# Patient Record
Sex: Female | Born: 1965
Health system: Southern US, Community
[De-identification: ages and names within clinical notes are randomized; demographics above are authoritative.]

## PROBLEM LIST (undated history)

## (undated) VITALS — BP 100/67 | HR 87 | Temp 97.9°F | Resp 12 | Ht 63.5 in | Wt 132.0 lb

## (undated) DIAGNOSIS — F419 Anxiety disorder, unspecified: Secondary | ICD-10-CM

## (undated) DIAGNOSIS — F32A Depression, unspecified: Secondary | ICD-10-CM

## (undated) DIAGNOSIS — F329 Major depressive disorder, single episode, unspecified: Secondary | ICD-10-CM

## (undated) HISTORY — PX: NO PAST SURGERIES: SHX2092

## (undated) HISTORY — PX: AUGMENTATION MAMMAPLASTY: SUR837

---

## 1998-04-28 ENCOUNTER — Other Ambulatory Visit: Admission: RE | Admit: 1998-04-28 | Discharge: 1998-04-28 | Payer: Self-pay | Admitting: Gynecology

## 1999-08-30 ENCOUNTER — Other Ambulatory Visit: Admission: RE | Admit: 1999-08-30 | Discharge: 1999-08-30 | Payer: Self-pay | Admitting: Gynecology

## 2000-06-05 ENCOUNTER — Other Ambulatory Visit: Admission: RE | Admit: 2000-06-05 | Discharge: 2000-06-05 | Payer: Self-pay | Admitting: Obstetrics & Gynecology

## 2000-06-07 ENCOUNTER — Ambulatory Visit (HOSPITAL_COMMUNITY): Admission: RE | Admit: 2000-06-07 | Discharge: 2000-06-07 | Payer: Self-pay | Admitting: Obstetrics and Gynecology

## 2000-06-07 ENCOUNTER — Encounter: Payer: Self-pay | Admitting: Obstetrics and Gynecology

## 2001-01-16 ENCOUNTER — Inpatient Hospital Stay (HOSPITAL_COMMUNITY): Admission: AD | Admit: 2001-01-16 | Discharge: 2001-01-18 | Payer: Self-pay | Admitting: Obstetrics and Gynecology

## 2001-01-19 ENCOUNTER — Encounter: Admission: RE | Admit: 2001-01-19 | Discharge: 2001-02-18 | Payer: Self-pay | Admitting: Obstetrics & Gynecology

## 2001-05-27 ENCOUNTER — Other Ambulatory Visit: Admission: RE | Admit: 2001-05-27 | Discharge: 2001-05-27 | Payer: Self-pay | Admitting: Gynecology

## 2002-11-06 ENCOUNTER — Other Ambulatory Visit: Admission: RE | Admit: 2002-11-06 | Discharge: 2002-11-06 | Payer: Self-pay | Admitting: Gynecology

## 2003-10-02 ENCOUNTER — Encounter: Admission: RE | Admit: 2003-10-02 | Discharge: 2003-10-02 | Payer: Self-pay | Admitting: Gynecology

## 2004-02-04 ENCOUNTER — Other Ambulatory Visit: Admission: RE | Admit: 2004-02-04 | Discharge: 2004-02-04 | Payer: Self-pay | Admitting: Gynecology

## 2005-03-31 ENCOUNTER — Other Ambulatory Visit: Admission: RE | Admit: 2005-03-31 | Discharge: 2005-03-31 | Payer: Self-pay | Admitting: Gynecology

## 2005-08-09 ENCOUNTER — Encounter: Admission: RE | Admit: 2005-08-09 | Discharge: 2005-08-09 | Payer: Self-pay | Admitting: Gynecology

## 2006-05-24 ENCOUNTER — Other Ambulatory Visit: Admission: RE | Admit: 2006-05-24 | Discharge: 2006-05-24 | Payer: Self-pay | Admitting: Family Medicine

## 2006-08-10 ENCOUNTER — Encounter: Admission: RE | Admit: 2006-08-10 | Discharge: 2006-08-10 | Payer: Self-pay | Admitting: Gynecology

## 2007-06-13 ENCOUNTER — Other Ambulatory Visit: Admission: RE | Admit: 2007-06-13 | Discharge: 2007-06-13 | Payer: Self-pay | Admitting: Family Medicine

## 2008-07-07 ENCOUNTER — Encounter: Admission: RE | Admit: 2008-07-07 | Discharge: 2008-07-07 | Payer: Self-pay | Admitting: Family Medicine

## 2008-07-07 ENCOUNTER — Other Ambulatory Visit: Admission: RE | Admit: 2008-07-07 | Discharge: 2008-07-07 | Payer: Self-pay | Admitting: Family Medicine

## 2009-01-07 ENCOUNTER — Encounter (INDEPENDENT_AMBULATORY_CARE_PROVIDER_SITE_OTHER): Payer: Self-pay | Admitting: General Surgery

## 2009-01-07 ENCOUNTER — Ambulatory Visit (HOSPITAL_BASED_OUTPATIENT_CLINIC_OR_DEPARTMENT_OTHER): Admission: RE | Admit: 2009-01-07 | Discharge: 2009-01-07 | Payer: Self-pay | Admitting: General Surgery

## 2009-08-12 ENCOUNTER — Encounter: Admission: RE | Admit: 2009-08-12 | Discharge: 2009-08-12 | Payer: Self-pay | Admitting: Obstetrics and Gynecology

## 2009-12-08 ENCOUNTER — Other Ambulatory Visit: Admission: RE | Admit: 2009-12-08 | Discharge: 2009-12-08 | Payer: Self-pay | Admitting: Family Medicine

## 2010-11-22 LAB — POCT HEMOGLOBIN-HEMACUE: Hemoglobin: 12.2 g/dL (ref 12.0–15.0)

## 2010-12-27 NOTE — Op Note (Signed)
NAMEMARLIE, KUENNEN                ACCOUNT NO.:  [id number]   MEDICAL RECORD NO.:  [id number]          PATIENT TYPE:  AMB   LOCATION:  DSC                          FACILITY:  [facility]   PHYSICIAN:  Almond Lint, MD       DATE OF BIRTH:  1966/05/22   DATE OF PROCEDURE:  01/07/2009  DATE OF DISCHARGE:                               OPERATIVE REPORT   PREOPERATIVE DIAGNOSIS:  External hemorrhoids.   POSTOPERATIVE DIAGNOSIS:  Internal and external hemorrhoids.   PROCEDURE:  External hemorrhoidectomy x3.   SURGEON:  Almond Lint, MD   ANESTHESIA:  General and local.   FINDINGS:  One large external hemorrhoid at the left posterior position,  a moderate size external hemorrhoids at the left anterior position, and  the small external hemorrhoid at the right posterior position.  There  was a small internal hemorrhoid at the left lateral position.   SPECIMENS:  External hemorrhoids to Pathology.   ESTIMATED BLOOD LOSS:  Minimal.   COMPLICATIONS:  None known.   PROCEDURE IN DETAIL:  Ms. Jungwirth was identified in the holding area and  taken to the operating room where she was placed supine on the operating  room table.  General anesthesia was induced.  She was then placed in a  lithotomy position and her perineum was prepped and draped in a sterile  fashion.  The anoscope was advanced through the anus to evaluate the  internal anal canal.  There were some very very small hemorrhoids  present and one small hemorrhoid at the left lateral position.  This was  not treated as it was very minimal.  The external hemorrhoids were  injected with local anesthesia.  First, the left posterior one was  addressed as this was the largest one.  The base was injected with local  anesthetic and this was then removed with the Bovie cautery.  The left  anterior was the next largest and this was similarly treated.  It was  injected with local anesthetic and then removed with the Bovie.  The  third one was  very small in the right posterior aspect and this was  removed with the Bovie after administration of local.  This small defect  was not closed.  The two defects on the left were closed using 3-0  chromic in interrupted fashion.  The anus was reexamined and there was  not significant tightening of the anus after this procedure.  There was  no residual bleeding seen.  The area was then cleaned and dried and  lidocaine jelly was placed on a Gelfoam  pad and advanced through the anal canal right into the distal rectum.  The incisions were then also treated with lidocaine jelly.  The patient  was then dressed with gauze, ABDs, and mesh underwear.  She was taken to  the PACU in stable condition.      Almond Lint, MD  Electronically Signed     FB/MEDQ  D:  01/07/2009  T:  01/08/2009  Job:  [id number]

## 2011-03-27 ENCOUNTER — Other Ambulatory Visit: Payer: Self-pay | Admitting: Physician Assistant

## 2011-03-27 ENCOUNTER — Other Ambulatory Visit (HOSPITAL_COMMUNITY)
Admission: RE | Admit: 2011-03-27 | Discharge: 2011-03-27 | Disposition: A | Discharge status: Home / Self Care | Payer: BC Managed Care – PPO | Source: Ambulatory Visit | Attending: Family Medicine | Admitting: Family Medicine

## 2011-03-27 DIAGNOSIS — Z124 Encounter for screening for malignant neoplasm of cervix: Secondary | ICD-10-CM | POA: Insufficient documentation

## 2012-04-02 ENCOUNTER — Other Ambulatory Visit: Payer: Self-pay | Admitting: Physician Assistant

## 2012-04-02 ENCOUNTER — Other Ambulatory Visit (HOSPITAL_COMMUNITY)
Admission: RE | Admit: 2012-04-02 | Discharge: 2012-04-02 | Disposition: A | Discharge status: Home / Self Care | Payer: BC Managed Care – PPO | Source: Ambulatory Visit | Attending: Family Medicine | Admitting: Family Medicine

## 2012-04-02 DIAGNOSIS — Z01419 Encounter for gynecological examination (general) (routine) without abnormal findings: Secondary | ICD-10-CM | POA: Insufficient documentation

## 2012-05-10 ENCOUNTER — Other Ambulatory Visit: Payer: Self-pay | Admitting: Physician Assistant

## 2012-05-10 DIAGNOSIS — Z1231 Encounter for screening mammogram for malignant neoplasm of breast: Secondary | ICD-10-CM

## 2012-05-16 ENCOUNTER — Ambulatory Visit
Admission: RE | Admit: 2012-05-16 | Discharge: 2012-05-16 | Disposition: A | Discharge status: Home / Self Care | Payer: BC Managed Care – PPO | Source: Ambulatory Visit | Attending: Physician Assistant | Admitting: Physician Assistant

## 2012-05-16 DIAGNOSIS — Z1231 Encounter for screening mammogram for malignant neoplasm of breast: Secondary | ICD-10-CM

## 2012-11-15 ENCOUNTER — Encounter (HOSPITAL_COMMUNITY): Payer: Self-pay | Admitting: *Deleted

## 2012-11-15 ENCOUNTER — Inpatient Hospital Stay (HOSPITAL_COMMUNITY)
Admission: RE | Admit: 2012-11-15 | Discharge: 2012-11-18 | DRG: 430 | Disposition: A | Discharge status: Home / Self Care | Payer: BC Managed Care – PPO | Attending: Psychiatry | Admitting: Psychiatry

## 2012-11-15 DIAGNOSIS — F331 Major depressive disorder, recurrent, moderate: Principal | ICD-10-CM | POA: Diagnosis present

## 2012-11-15 DIAGNOSIS — F1310 Sedative, hypnotic or anxiolytic abuse, uncomplicated: Secondary | ICD-10-CM | POA: Diagnosis present

## 2012-11-15 DIAGNOSIS — Z79899 Other long term (current) drug therapy: Secondary | ICD-10-CM

## 2012-11-15 DIAGNOSIS — F1010 Alcohol abuse, uncomplicated: Secondary | ICD-10-CM | POA: Diagnosis present

## 2012-11-15 LAB — URINE MICROSCOPIC-ADD ON

## 2012-11-15 LAB — URINALYSIS, ROUTINE W REFLEX MICROSCOPIC
Hgb urine dipstick: NEGATIVE
Nitrite: NEGATIVE
Protein, ur: NEGATIVE mg/dL
Urobilinogen, UA: 1 mg/dL (ref 0.0–1.0)

## 2012-11-15 LAB — PREGNANCY, URINE: Preg Test, Ur: NEGATIVE

## 2012-11-15 LAB — RAPID URINE DRUG SCREEN, HOSP PERFORMED
Amphetamines: NOT DETECTED
Tetrahydrocannabinol: NOT DETECTED

## 2012-11-15 MED ORDER — VITAMIN B-1 100 MG PO TABS
100.0000 mg | ORAL_TABLET | Freq: Every day | ORAL | Status: DC
  Administered 2012-11-16 – 2012-11-18 (×3): 100 mg via ORAL
  Filled 2012-11-15 (×4): qty 1

## 2012-11-15 MED ORDER — HYDROXYZINE HCL 25 MG PO TABS: 25.0000 mg | ORAL_TABLET | Freq: Four times a day (QID) | ORAL | Status: AC | PRN

## 2012-11-15 MED ORDER — LOPERAMIDE HCL 2 MG PO CAPS: 2.0000 mg | ORAL_CAPSULE | ORAL | Status: AC | PRN

## 2012-11-15 MED ORDER — TRAZODONE HCL 50 MG PO TABS
50.0000 mg | ORAL_TABLET | Freq: Every evening | ORAL | Status: DC | PRN
  Administered 2012-11-15 – 2012-11-17 (×3): 50 mg via ORAL
  Filled 2012-11-15 (×8): qty 1

## 2012-11-15 MED ORDER — ONDANSETRON 4 MG PO TBDP: 4.0000 mg | ORAL_TABLET | Freq: Four times a day (QID) | ORAL | Status: AC | PRN

## 2012-11-15 MED ORDER — CHLORDIAZEPOXIDE HCL 25 MG PO CAPS
25.0000 mg | ORAL_CAPSULE | ORAL | Status: AC
  Administered 2012-11-17 (×2): 25 mg via ORAL
  Filled 2012-11-15: qty 1

## 2012-11-15 MED ORDER — THIAMINE HCL 100 MG/ML IJ SOLN: 100.0000 mg | Freq: Once | INTRAMUSCULAR | Status: DC

## 2012-11-15 MED ORDER — ACETAMINOPHEN 325 MG PO TABS: 650.0000 mg | ORAL_TABLET | Freq: Four times a day (QID) | ORAL | Status: DC | PRN

## 2012-11-15 MED ORDER — ENSURE COMPLETE PO LIQD
237.0000 mL | Freq: Two times a day (BID) | ORAL | Status: DC
  Administered 2012-11-17 – 2012-11-18 (×3): 237 mL via ORAL

## 2012-11-15 MED ORDER — CHLORDIAZEPOXIDE HCL 25 MG PO CAPS
25.0000 mg | ORAL_CAPSULE | Freq: Once | ORAL | Status: AC
  Administered 2012-11-15: 25 mg via ORAL

## 2012-11-15 MED ORDER — CHLORDIAZEPOXIDE HCL 25 MG PO CAPS
25.0000 mg | ORAL_CAPSULE | Freq: Four times a day (QID) | ORAL | Status: AC | PRN
  Administered 2012-11-17: 25 mg via ORAL
  Filled 2012-11-15: qty 1

## 2012-11-15 MED ORDER — CHLORDIAZEPOXIDE HCL 25 MG PO CAPS
25.0000 mg | ORAL_CAPSULE | Freq: Four times a day (QID) | ORAL | Status: AC
  Administered 2012-11-15 (×3): 25 mg via ORAL
  Filled 2012-11-15 (×3): qty 1

## 2012-11-15 MED ORDER — ALUM & MAG HYDROXIDE-SIMETH 200-200-20 MG/5ML PO SUSP: 30.0000 mL | ORAL | Status: DC | PRN

## 2012-11-15 MED ORDER — CHLORDIAZEPOXIDE HCL 25 MG PO CAPS
25.0000 mg | ORAL_CAPSULE | Freq: Three times a day (TID) | ORAL | Status: AC
  Administered 2012-11-16 (×3): 25 mg via ORAL
  Filled 2012-11-15 (×3): qty 1

## 2012-11-15 MED ORDER — MAGNESIUM HYDROXIDE 400 MG/5ML PO SUSP
30.0000 mL | Freq: Every day | ORAL | Status: DC | PRN
  Administered 2012-11-17: 30 mL via ORAL

## 2012-11-15 MED ORDER — CHLORDIAZEPOXIDE HCL 25 MG PO CAPS
25.0000 mg | ORAL_CAPSULE | Freq: Every day | ORAL | Status: AC
  Administered 2012-11-18: 25 mg via ORAL
  Filled 2012-11-15 (×2): qty 1

## 2012-11-15 MED ORDER — ADULT MULTIVITAMIN W/MINERALS CH
1.0000 | ORAL_TABLET | Freq: Every day | ORAL | Status: DC
  Administered 2012-11-15 – 2012-11-18 (×4): 1 via ORAL
  Filled 2012-11-15 (×5): qty 1

## 2012-11-15 NOTE — Progress Notes (Signed)
[facility] LCSW Group Therapy  11/15/2012 1:15  Type of Therapy:  Group Therapy 1:15 to 2:30  Participation Level:  Active  Participation Quality:  Appropriate, Attentive and Sharing  Affect:   Appropriate  Cognitive:  Alert, Appropriate and Oriented  Insight:  Developing  Engagement in Therapy: Appropriate  Modes of Intervention:  Clarification, Discussion, Exploration, Orientation and Reality Testing  Summary of Progress/Problems: Focus of group processing discussion was on balance in life; the components in life which have a negative influence on balance and the components which make for a more balanced life.  Patient shared that she has felt overwhelmed for months and the alcohol and xanax have gotten out of control.  Patient reports she has financial, relationship, environmental stressors and currently feels out of control whereas her usual mode of operation is strict control and order. Patient reports she arranged today to come in for detox knowing her daughter would be with ex for the next week yet with the stress of today's plan still honored her daughter's request to throw the softball for practice this morning before school.   Clide Dales
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
## 2012-11-15 NOTE — Progress Notes (Signed)
Nutrition Brief Note  Patient identified on the Malnutrition Screening Tool (MST) Report  Body mass index is 24.14 kg/(m^2). Patient meets criteria for WNL based on current BMI.   Current diet order is regular, patient is consuming approximately small% of meals at this time. Labs and medications reviewed.   Patient stated that she had not been eating well for the past 2 weeks prior to admit with a 6 lb weight loss in the past week.  Poor intake secondary to poor appetite, stress and anxiety.  Eating small amounts here.  Complains of stomach pain related to stress and anxiety.  Ht 5'2", Wt:  132 lbs, UBW 138 lbs.  Complaints of increased thirst for the past 2 days and very strong smelling urine.  Will order Ensure Complete po BID, each supplement provides 350 kcal and 13 grams of protein. Discussed importance of good nutrition and tips to increase intake with decreased appetite.  Encouraged extra fluid intake to improve hydration.  HgbA1C pending.  Meds include MVI and thiamine.  No nutrition interventions warranted at this time. If nutrition issues arise, please consult RD.   Oran Rein, RD, LDN Clinical Inpatient Dietitian Pager:  [phone] Weekend and after hours pager:  [phone]
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
## 2012-11-15 NOTE — Progress Notes (Signed)
Took over Pt's care at 2330.  Resting in bed, no distress noted, even and unlabored respirations, will continue to monitor.

## 2012-11-16 DIAGNOSIS — F1010 Alcohol abuse, uncomplicated: Secondary | ICD-10-CM | POA: Diagnosis present

## 2012-11-16 DIAGNOSIS — F1310 Sedative, hypnotic or anxiolytic abuse, uncomplicated: Secondary | ICD-10-CM

## 2012-11-16 DIAGNOSIS — F339 Major depressive disorder, recurrent, unspecified: Secondary | ICD-10-CM

## 2012-11-16 DIAGNOSIS — F331 Major depressive disorder, recurrent, moderate: Principal | ICD-10-CM | POA: Diagnosis present

## 2012-11-16 LAB — HEPATIC FUNCTION PANEL
ALT: 14 U/L (ref 0–35)
AST: 21 U/L (ref 0–37)
Albumin: 4.1 g/dL (ref 3.5–5.2)
Alkaline Phosphatase: 74 U/L (ref 39–117)
Total Protein: 7.6 g/dL (ref 6.0–8.3)

## 2012-11-16 LAB — COMPREHENSIVE METABOLIC PANEL
ALT: 14 U/L (ref 0–35)
AST: 21 U/L (ref 0–37)
Albumin: 4.1 g/dL (ref 3.5–5.2)
Alkaline Phosphatase: 74 U/L (ref 39–117)
Potassium: 3.5 mEq/L (ref 3.5–5.1)
Sodium: 138 mEq/L (ref 135–145)
Total Protein: 7.7 g/dL (ref 6.0–8.3)

## 2012-11-16 LAB — CBC
Hemoglobin: 13.9 g/dL (ref 12.0–15.0)
MCHC: 34.3 g/dL (ref 30.0–36.0)
RDW: 12.5 % (ref 11.5–15.5)
WBC: 4.7 10*3/uL (ref 4.0–10.5)

## 2012-11-16 LAB — MAGNESIUM: Magnesium: 1.8 mg/dL (ref 1.5–2.5)

## 2012-11-16 LAB — LIPID PANEL
Cholesterol: 214 mg/dL — ABNORMAL HIGH (ref 0–200)
LDL Cholesterol: 102 mg/dL — ABNORMAL HIGH (ref 0–99)
Total CHOL/HDL Ratio: 2.2 RATIO
VLDL: 15 mg/dL (ref 0–40)

## 2012-11-16 LAB — TSH: TSH: 1.093 u[IU]/mL (ref 0.350–4.500)

## 2012-11-16 MED ORDER — IBUPROFEN 600 MG PO TABS: 600.0000 mg | ORAL_TABLET | Freq: Four times a day (QID) | ORAL | Status: DC | PRN

## 2012-11-16 MED ORDER — CITALOPRAM HYDROBROMIDE 40 MG PO TABS
40.0000 mg | ORAL_TABLET | Freq: Every day | ORAL | Status: DC
  Administered 2012-11-16 – 2012-11-18 (×3): 40 mg via ORAL
  Filled 2012-11-16 (×4): qty 1

## 2012-11-16 MED ORDER — GABAPENTIN 100 MG PO CAPS
100.0000 mg | ORAL_CAPSULE | Freq: Three times a day (TID) | ORAL | Status: DC
  Administered 2012-11-16 – 2012-11-18 (×7): 100 mg via ORAL
  Filled 2012-11-16 (×9): qty 1

## 2012-11-16 NOTE — H&P (Signed)
  Pt was seen by me today and I agree with the key elements documented in H&P.  

## 2012-11-16 NOTE — Progress Notes (Signed)
Psychoeducational Group Note  Date:  11/16/2012 Time: 1015  Group Topic/Focus:  Identifying Goals :   The focus of this group is to help patients identify their personal goal for the day. Pt stated she wanted to " begin to accept that she can stand up on my own ".  Participation Level:  active Participation Quality: good Affect: flat Cognitive:  good  Insight:  good  Engagement in Group: engaged  Additional Comments:    PD RN [facility]

## 2012-11-16 NOTE — [facility] Suicide Risk Assessment (Signed)
Suicide Risk Assessment  Admission Assessment     Nursing information obtained from:  Patient Demographic factors:  Divorced or widowed;Caucasian;Unemployed Current Mental Status:   no si, no hi, no avh, logical Loss Factors:  Decrease in vocational status;Loss of significant relationship;Financial problems / change in socioeconomic status Historical Factors:  Family history of mental illness or substance abuse;Impulsivity Risk Reduction Factors:  Responsible for children under 54 years of age;Sense of responsibility to family;Living with another person, especially a relative;Positive social support;Positive therapeutic relationship  CLINICAL FACTORS:   Alcohol/Substance Abuse/Dependencies  COGNITIVE FEATURES THAT CONTRIBUTE TO RISK:  Closed-mindedness    SUICIDE RISK:   Mild:  Suicidal ideation of limited frequency, intensity, duration, and specificity.  There are no identifiable plans, no associated intent, mild dysphoria and related symptoms, good self-control (both objective and subjective assessment), few other risk factors, and identifiable protective factors, including available and accessible social support.  PLAN OF CARE:   Will re-start celexa for depression  I certify that inpatient services furnished can reasonably be expected to improve the patient's condition.  Wonda Cerise 11/16/2012, 1:10 PM
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
## 2012-11-16 NOTE — [facility] Group Notes (Signed)
[facility] LCSW Group Therapy  11/16/2012 10am  Type of Therapy:  Group Therapy  Participation Level:  Active  Participation Quality:  Appropriate, Attentive and Sharing  Affect:  Blunted  Cognitive:  Appropriate  Insight:  Developing/Improving  Engagement in Therapy:  Engaged  Modes of Intervention:  Exploration  Summary of Progress/Problems:  The main focus of today's process group was for the patient to identify ways in which they have in the past sabotaged their own recovery. Motivational Interviewing was utilized to ask the group members what they get out of their substance use, and how their behavior interferes with who they want to be.  The Stages of Change were explained, and members rated their motivation to change on a scale of 0 (no desire) to 10 (completely committed).  The patient expressed that she cannot take care of her 49yo daughter, cannot even take care of herself.  She is not the mother she wants to be.  The doctor called her out of the room prior to the end of group.  Sarina Ser 11/16/2012, 12:39 PM
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
## 2012-11-17 DIAGNOSIS — F331 Major depressive disorder, recurrent, moderate: Principal | ICD-10-CM

## 2012-11-17 DIAGNOSIS — F1010 Alcohol abuse, uncomplicated: Secondary | ICD-10-CM

## 2012-11-17 DIAGNOSIS — F1310 Sedative, hypnotic or anxiolytic abuse, uncomplicated: Secondary | ICD-10-CM

## 2012-11-17 MED ORDER — GLYCERIN (LAXATIVE) 2.1 G RE SUPP
1.0000 | RECTAL | Status: DC | PRN
  Administered 2012-11-18: 1 via RECTAL
  Filled 2012-11-17: qty 1

## 2012-11-17 NOTE — Progress Notes (Signed)
Thankfulness Group Note  Date: 11/17/2012 Time: 0900  Group Topic/Focus:  Thankfulness Group :   The focus of this group is to help patients identify things / people / events they are thankful for..  Participation Level:  Did Not Attend  PAdditional Comments:    11/17/2012,9:44 AM Rich Brave

## 2012-11-17 NOTE — Progress Notes (Signed)
Patient ID: Heather Reilly, female   DOB: [birth year], 47 y.o.   MRN: [id number] D)  Has been out and about in the milieu, interacting with peers appropriately, was in her room working on homework assigned earlier, feels she needed help and is trying to get herself straightened out, getting her focus back on how she needs to be for herself and her family.  Attemnded group, played cards and had a snack, appreciates her roommate who is also supportive. A)  Will continue to monitor ffor safety, support and encouragement, continue POC R)  Safety maintained.

## 2012-11-17 NOTE — Progress Notes (Signed)
[facility] Group Notes:  (Nursing/MHT/Case Management/Adjunct)  Date:  11/16/2012  Time:  2100  Type of Therapy:  wrap up group  Participation Level:  Active  Participation Quality:  Appropriate, Attentive, Sharing and Supportive  Affect:  Appropriate  Cognitive:  Alert and Appropriate  Insight:  Good  Engagement in Group:  Engaged  Modes of Intervention:  Clarification, Education and Support  Summary of Progress/Problems:  Heather Reilly 11/17/2012, 2:57 AM

## 2012-11-17 NOTE — [facility] Group Notes (Signed)
[facility] LCSW Group Therapy  11/17/2012   Did not attend  Ambrose Mantle, LCSW 11/17/2012, 12:11 PM

## 2012-11-17 NOTE — Progress Notes (Signed)
Patient ID: Heather Reilly, female   DOB: 1965/12/26, 47 y.o.   MRN: [id number] D)  Has been out and about, participating in the milieu and interacting appropriately with peers.  Came up to the window and requested a glycerine suppository, order was obtained.  States has been having a good evening, feeling better, less anxious, feels she is getting her plans together for after discharge. Feeling that she is past the acute phase of withdrawal, and is making plans to move ahead with her life, considering going back to school.  Denies thoughts of self harm. A)  Will continue to monitor for safety, offer support and encouragement R)  Safety maintained

## 2012-11-17 NOTE — Progress Notes (Signed)
Psychoeducational Group Note  Date:  11/17/2012 Time: 1015 Group Topic/Focus:  Making Healthy Choices:   The focus of this group is to help patients identify negative/unhealthy choices they were using prior to admission and identify positive/healthier coping strategies to replace them upon discharge.  Participation Level:  Active  Participation Quality:  Appropriate  Affect:  Appropriate  Cognitive:  Appropriate  Insight:  Engaged  Engagement in Group:  Engaged  Additional Comments:    Heather Reilly 4:23 PM. 11/17/2012

## 2012-11-17 NOTE — Progress Notes (Signed)
[facility] MD Progress Note  11/17/2012 3:56 PM Heather Reilly  MRN:  [id number]  Subjective:  Feeling pretty. Woke this am without any anxiety. Slept well. I think that I will give Neurontin a try".  Diagnosis:  Axis I: Major depressive disorder, recurrent episode, moderate, Alcohol abuse, Benzodiazepine abuse Axis II: Deferred Axis III: History reviewed. No pertinent past medical history. Axis IV: other psychosocial or environmental problems and Substance absue issues Axis V: 41-50 serious symptoms  ADL's:  Intact  Sleep: Good  Appetite:  Good  Suicidal Ideation:  Denies Homicidal Ideation:  Denies  AEB (as evidenced by):  Psychiatric Specialty Exam: Review of Systems  Constitutional: Negative.   HENT: Negative.   Eyes: Negative.   Respiratory: Negative.   Cardiovascular: Negative.   Gastrointestinal: Negative.   Musculoskeletal: Negative.   Skin: Negative.   Neurological: Negative.   Endo/Heme/Allergies: Negative.   Psychiatric/Behavioral: Positive for depression and substance abuse. Negative for suicidal ideas, hallucinations and memory loss. The patient is not nervous/anxious and does not have insomnia.     Blood pressure 98/62, pulse 90, temperature 98.3 F (36.8 C), temperature source Oral, resp. rate 18, height 5' 3.5" (1.613 m), weight 59.875 kg (132 lb), last menstrual period 11/15/2012, SpO2 99.00%.Body mass index is 23.01 kg/(m^2).  General Appearance: Casual  Eye Contact::  Good  Speech:  Clear and Coherent  Volume:  Normal  Mood:  better  Affect:  Appropriate  Thought Process:  Coherent  Orientation:  Full (Time, Place, and Person)  Thought Content:  Rumination  Suicidal Thoughts:  No  Homicidal Thoughts:  No  Memory:  Immediate;   Good Recent;   Good Remote;   Good  Judgement:  Fair  Insight:  Fair  Psychomotor Activity:  Normal  Concentration:  Good  Recall:  Good  Akathisia:  No  Handed:  Right  AIMS (if indicated):     Assets:  Desire for  Improvement  Sleep:  Number of Hours: 5.5   Current Medications: Current Facility-Administered Medications  Medication Dose Route Frequency Provider Last Rate Last Dose  . acetaminophen (TYLENOL) tablet 650 mg  650 mg Oral Q6H PRN Shuvon Rankin, NP      . alum & mag hydroxide-simeth (MAALOX/MYLANTA) 200-200-20 MG/5ML suspension 30 mL  30 mL Oral Q4H PRN Shuvon Rankin, NP      . chlordiazePOXIDE (LIBRIUM) capsule 25 mg  25 mg Oral Q6H PRN Shuvon Rankin, NP   25 mg at 11/17/12 1214  . chlordiazePOXIDE (LIBRIUM) capsule 25 mg  25 mg Oral BH-qamhs Shuvon Rankin, NP   25 mg at 11/17/12 0906   Followed by  . [START ON 11/18/2012] chlordiazePOXIDE (LIBRIUM) capsule 25 mg  25 mg Oral Daily Shuvon Rankin, NP      . citalopram (CELEXA) tablet 40 mg  40 mg Oral Daily Sanjuana Kava, NP   40 mg at 11/17/12 0906  . feeding supplement (ENSURE COMPLETE) liquid 237 mL  237 mL Oral BID BM Jeoffrey Massed, RD   237 mL at 11/17/12 1342  . gabapentin (NEURONTIN) capsule 100 mg  100 mg Oral TID Sanjuana Kava, NP   100 mg at 11/17/12 1211  . hydrOXYzine (ATARAX/VISTARIL) tablet 25 mg  25 mg Oral Q6H PRN Shuvon Rankin, NP      . ibuprofen (ADVIL,MOTRIN) tablet 600 mg  600 mg Oral Q6H PRN Sanjuana Kava, NP      . loperamide (IMODIUM) capsule 2-4 mg  2-4 mg Oral PRN Shuvon Rankin, NP      .  magnesium hydroxide (MILK OF MAGNESIA) suspension 30 mL  30 mL Oral Daily PRN Shuvon Rankin, NP      . multivitamin with minerals tablet 1 tablet  1 tablet Oral Daily Shuvon Rankin, NP   1 tablet at 11/17/12 0907  . ondansetron (ZOFRAN-ODT) disintegrating tablet 4 mg  4 mg Oral Q6H PRN Shuvon Rankin, NP      . thiamine (B-1) injection 100 mg  100 mg Intramuscular Once Shuvon Rankin, NP      . thiamine (VITAMIN B-1) tablet 100 mg  100 mg Oral Daily Shuvon Rankin, NP   100 mg at 11/17/12 0907  . traZODone (DESYREL) tablet 50 mg  50 mg Oral QHS,MR X 1 Nanine Means, NP   50 mg at 11/16/12 2315    Lab Results:  Results for orders placed  during the hospital encounter of 11/15/12 (from the past 48 hour(s))  CBC     Status: None   Collection Time    11/16/12  6:32 AM      Result Value Range   WBC 4.7  4.0 - 10.5 K/uL   RBC 4.47  3.87 - 5.11 MIL/uL   Hemoglobin 13.9  12.0 - 15.0 g/dL   HCT 44.0  10.2 - 72.5 %   MCV 90.6  78.0 - 100.0 fL   MCH 31.1  26.0 - 34.0 pg   MCHC 34.3  30.0 - 36.0 g/dL   RDW 36.6  44.0 - 34.7 %   Platelets 248  150 - 400 K/uL  COMPREHENSIVE METABOLIC PANEL     Status: None   Collection Time    11/16/12  6:32 AM      Result Value Range   Sodium 138  135 - 145 mEq/L   Potassium 3.5  3.5 - 5.1 mEq/L   Chloride 100  96 - 112 mEq/L   CO2 30  19 - 32 mEq/L   Glucose, Bld 90  70 - 99 mg/dL   BUN 10  6 - 23 mg/dL   Creatinine, Ser 4.25  0.50 - 1.10 mg/dL   Calcium 9.4  8.4 - 95.6 mg/dL   Total Protein 7.7  6.0 - 8.3 g/dL   Albumin 4.1  3.5 - 5.2 g/dL   AST 21  0 - 37 U/L   ALT 14  0 - 35 U/L   Alkaline Phosphatase 74  39 - 117 U/L   Total Bilirubin 0.6  0.3 - 1.2 mg/dL   GFR calc non Af Amer >90  >90 mL/min   GFR calc Af Amer >90  >90 mL/min   Comment:            The eGFR has been calculated     using the CKD EPI equation.     This calculation has not been     validated in all clinical     situations.     eGFR's persistently     <90 mL/min signify     possible Chronic Kidney Disease.  ETHANOL     Status: None   Collection Time    11/16/12  6:32 AM      Result Value Range   Alcohol, Ethyl (B) <11  0 - 11 mg/dL   Comment:            LOWEST DETECTABLE LIMIT FOR     SERUM ALCOHOL IS 11 mg/dL     FOR MEDICAL PURPOSES ONLY  HEMOGLOBIN A1C     Status: None   Collection Time  11/16/12  6:32 AM      Result Value Range   Hemoglobin A1C 5.1  <5.7 %   Comment: (NOTE)                                                                               According to the ADA Clinical Practice Recommendations for 2011, when     HbA1c is used as a screening test:      >=6.5%   Diagnostic of Diabetes  Mellitus               (if abnormal result is confirmed)     5.7-6.4%   Increased risk of developing Diabetes Mellitus     References:Diagnosis and Classification of Diabetes Mellitus,Diabetes     Care,2011,34(Suppl 1):S62-S69 and Standards of Medical Care in             Diabetes - 2011,Diabetes Care,2011,34 (Suppl 1):S11-S61.   Mean Plasma Glucose 100  <117 mg/dL  HEPATIC FUNCTION PANEL     Status: None   Collection Time    11/16/12  6:32 AM      Result Value Range   Total Protein 7.6  6.0 - 8.3 g/dL   Albumin 4.1  3.5 - 5.2 g/dL   AST 21  0 - 37 U/L   ALT 14  0 - 35 U/L   Alkaline Phosphatase 74  39 - 117 U/L   Total Bilirubin 0.6  0.3 - 1.2 mg/dL   Bilirubin, Direct 0.1  0.0 - 0.3 mg/dL   Indirect Bilirubin 0.5  0.3 - 0.9 mg/dL  LIPID PANEL     Status: Abnormal   Collection Time    11/16/12  6:32 AM      Result Value Range   Cholesterol 214 (*) 0 - 200 mg/dL   Triglycerides 76  <161 mg/dL   HDL 97  >09 mg/dL   Total CHOL/HDL Ratio 2.2     VLDL 15  0 - 40 mg/dL   LDL Cholesterol 604 (*) 0 - 99 mg/dL   Comment:            Total Cholesterol/HDL:CHD Risk     Coronary Heart Disease Risk Table                         Men   Women      1/2 Average Risk   3.4   3.3      Average Risk       5.0   4.4      2 X Average Risk   9.6   7.1      3 X Average Risk  23.4   11.0                Use the calculated Patient Ratio     above and the CHD Risk Table     to determine the patient's CHD Risk.                ATP III CLASSIFICATION (LDL):      <100     mg/dL   Optimal      540-981  mg/dL   Near or  Above                        Optimal      130-159  mg/dL   Borderline      161-096  mg/dL   High      >045     mg/dL   Very High  MAGNESIUM     Status: None   Collection Time    11/16/12  6:32 AM      Result Value Range   Magnesium 1.8  1.5 - 2.5 mg/dL  TSH     Status: None   Collection Time    11/16/12  6:32 AM      Result Value Range   TSH 1.093  0.350 - 4.500 uIU/mL     Physical Findings: AIMS: Facial and Oral Movements Muscles of Facial Expression: None, normal Lips and Perioral Area: None, normal Jaw: None, normal Tongue: None, normal,Extremity Movements Upper (arms, wrists, hands, fingers): None, normal Lower (legs, knees, ankles, toes): None, normal, Trunk Movements Neck, shoulders, hips: None, normal, Overall Severity Severity of abnormal movements (highest score from questions above): None, normal Incapacitation due to abnormal movements: None, normal Patient's awareness of abnormal movements (rate only patient's report): No Awareness, Dental Status Current problems with teeth and/or dentures?: No Does patient usually wear dentures?: No  CIWA:  CIWA-Ar Total: 0 COWS:  COWS Total Score: 3  Treatment Plan Summary: Daily contact with patient to assess and evaluate symptoms and progress in treatment Medication management  Plan: Supportive approach/coping skills/relapse prevention. Encouraged out of room, participation in group sessions and application of coping skills when distressed. Will continue to monitor response to/adverse effects of medications in use to assure effectiveness. Continue to monitor mood, behavior and interaction with staff and other patients. Continue current plan of care.  Medical Decision Making Problem Points:  Established problem, stable/improving (1), Review of last therapy session (1) and Review of psycho-social stressors (1) Data Points:  Review of medication regiment & side effects (2) Review of new medications or change in dosage (2)  I certify that inpatient services furnished can reasonably be expected to improve the patient's condition.   Armandina Stammer I 11/17/2012, 3:56 PM

## 2012-11-17 NOTE — Progress Notes (Addendum)
Pt is very pleasant and cooperative this am and very verbal. Stated she thought about killing herself by starting her car in the garage and breathing in the carbon monoxide. She stated she saw her ex with a new GF, who is a Runner, broadcasting/film/video, with a master's degree. Pt stated she still has feelings for her ex even though they are divorced. She stated she realizes her 49 year old daughter needs her very much and feels that the daughter worries about her often. Pt stated when she could not get xanax she resorted to benedryl to make her tired. Pt did state that people in her family are suppose to be strong and her behavior shows weakness on her part. 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 She also plans to go back to school. Pt did ask the nurse to watch her dry her hair. She admits that her ex always talks down to her and blames her for his drinking problems.Pt is fearful that his new GF may try to take her place as a mom.Pt also stated she lives in [place] and it is far away from family. Seldom do people come and visit and at times she does feel very alone. Pt feels her ex bought this house far away from her family partly as a control issue. Pt has been going to group this am and does contract for safety. She did admit to researching on the internet the best way to die with carbon monoxide poisioning. Pt denies Si or HI. Pt would like to concentrate on getting a job and concentrate on herself. Pt stated," My only concern is going home and accepting that I am going home to a house where I may be alone now."Pt stated her depression is a 3/10 and her hope;essness is a 3/10./ pt given a 25mg  of librium at 12noon for nerves.

## 2012-11-18 MED ORDER — GABAPENTIN 100 MG PO CAPS: 100.0000 mg | ORAL_CAPSULE | Freq: Three times a day (TID) | ORAL | Status: DC

## 2012-11-18 MED ORDER — ADULT MULTIVITAMIN W/MINERALS CH: 1.0000 | ORAL_TABLET | Freq: Every day | ORAL | Status: DC

## 2012-11-18 MED ORDER — HYDROXYZINE HCL 25 MG PO TABS: 25.0000 mg | ORAL_TABLET | Freq: Four times a day (QID) | ORAL | Status: DC | PRN

## 2012-11-18 MED ORDER — CITALOPRAM HYDROBROMIDE 40 MG PO TABS: 40.0000 mg | ORAL_TABLET | Freq: Every day | ORAL | Status: DC

## 2012-11-18 MED ORDER — TRAZODONE HCL 50 MG PO TABS: 50.0000 mg | ORAL_TABLET | Freq: Every evening | ORAL | Status: DC | PRN

## 2012-11-18 NOTE — Discharge Summary (Signed)
Physician Discharge Summary Note  Patient:  Heather Reilly is an 47 y.o., female MRN:  [id number] DOB:  03-25-1966 Patient phone:  [phone] (home)  Patient address:   [address] [place] [address],   Date of Admission:  11/15/2012 Date of Discharge: 11/18/2012  Reason for Admission:  Alcohol detox and depression  Discharge Diagnoses: Principal Problem:   Benzodiazepine abuse, continuous Active Problems:   Major depressive disorder, recurrent episode, moderate   Alcohol abuse  Review of Systems  Constitutional: Negative.   HENT: Negative.   Eyes: Negative.   Respiratory: Negative.   Cardiovascular: Negative.   Gastrointestinal: Negative.   Genitourinary: Negative.   Musculoskeletal: Negative.   Skin: Negative.   Neurological: Negative.   Endo/Heme/Allergies: Negative.   Psychiatric/Behavioral: Positive for depression and substance abuse.   Axis Diagnosis:   AXIS I:  Alcohol Abuse, Substance Abuse and Substance Induced Mood Disorder AXIS II:  Deferred AXIS III:  History reviewed. No pertinent past medical history. AXIS IV:  other psychosocial or environmental problems, problems related to social environment and problems with primary support group AXIS V:  61-70 mild symptoms  Level of Care:  OP  Hospital Course:  On admission: She was admitted as a walk-in to [facility]. Patient's complaints include increased depression, anxiety and excessive alcohol drinking. She reports, "I'm having a lot of anxiety, depression and hurt. I started to take too many of my Xanax pills, Citalopram pills and drinking a lot of alcohol the same time. This is to help me fall asleep and to kill my emotional pains. This worked briefly and temporarily, my anxiety will go away. Then I will wake up with even more/higher anxiety. While all these is going on, I started to notice that my memory is being affected. I am no longer able to remember what I did or happened 2 days ago. As my alcohol  consumption increased, I started to lose my appetite too. My drinking of alcohol started about 2 years ago, just to help me sleep. Then it got to where I was knocking down 6-9 bottles in a day. My husband and I separated in 2009, then finalized the divorce in 2013. My son left to [place] to study, and I have to share custody of my daughter with my husband. What more, I lost my job too. I felt like I have lost everybody in my life. What more, my father, my role model is also dying. I don't think that he has long to live. My primary care provider is the one prescribing me my Xanax and Citalopram".   During hospitalization:  Medication was managed.  Xanax discontinued, librium protocol started and completed.  Celexa 40 mg daily for depression continued.  Gabapentin 100 mg TID started for her neuropathic pain symptoms, Vistaril 25 mg for anxiety, and Trazodone 50 mg for sleep.  She attended most groups and participated--she wants to improve her social network, go back to work and school.  Her stressors were her recent divorce with continuation of feelings for her ex-husband, her son leaving for [place], and her father who is sick.  She has an 46 year old daughter and wants to improve her parenting skills by working on herself so she can be there for her daughter.  Patient denied suicidal/homicidal ideations and auditory/visual hallucinations, follow-up appointments encouraged to attend, outside support groups encouraged.  Heather Reilly is mentally and physically stable for discharge, Rx given.  Consults:  None  Significant Diagnostic Studies:  labs: Completed and reviewed,  stable  Discharge Vitals:   Blood pressure 100/67, pulse 87, temperature 97.9 F (36.6 C), temperature source Oral, resp. rate 12, height 5' 3.5" (1.613 m), weight 59.875 kg (132 lb), last menstrual period 11/15/2012, SpO2 99.00%. Body mass index is 23.01 kg/(m^2). Lab Results:   Results for orders placed during the hospital encounter of 11/15/12  (from the past 72 hour(s))  URINALYSIS, ROUTINE W REFLEX MICROSCOPIC     Status: Abnormal   Collection Time    11/15/12  3:38 PM      Result Value Range   Color, Urine YELLOW  YELLOW   APPearance TURBID (*) CLEAR   Specific Gravity, Urine 1.031 (*) 1.005 - 1.030   pH 6.0  5.0 - 8.0   Glucose, UA NEGATIVE  NEGATIVE mg/dL   Hgb urine dipstick NEGATIVE  NEGATIVE   Bilirubin Urine SMALL (*) NEGATIVE   Ketones, ur NEGATIVE  NEGATIVE mg/dL   Protein, ur NEGATIVE  NEGATIVE mg/dL   Urobilinogen, UA 1.0  0.0 - 1.0 mg/dL   Nitrite NEGATIVE  NEGATIVE   Leukocytes, UA SMALL (*) NEGATIVE  PREGNANCY, URINE     Status: None   Collection Time    11/15/12  3:38 PM      Result Value Range   Preg Test, Ur NEGATIVE  NEGATIVE   Comment:            THE SENSITIVITY OF THIS     METHODOLOGY IS >20 mIU/mL.  URINE MICROSCOPIC-ADD ON     Status: Abnormal   Collection Time    11/15/12  3:38 PM      Result Value Range   Squamous Epithelial / LPF MANY (*) RARE   Urine-Other AMORPHOUS URATES/PHOSPHATES    URINE RAPID DRUG SCREEN (HOSP PERFORMED)     Status: Abnormal   Collection Time    11/15/12  3:41 PM      Result Value Range   Opiates NONE DETECTED  NONE DETECTED   Cocaine NONE DETECTED  NONE DETECTED   Benzodiazepines POSITIVE (*) NONE DETECTED   Amphetamines NONE DETECTED  NONE DETECTED   Tetrahydrocannabinol NONE DETECTED  NONE DETECTED   Barbiturates NONE DETECTED  NONE DETECTED   Comment:            DRUG SCREEN FOR MEDICAL PURPOSES     ONLY.  IF CONFIRMATION IS NEEDED     FOR ANY PURPOSE, NOTIFY LAB     WITHIN 5 DAYS.                LOWEST DETECTABLE LIMITS     FOR URINE DRUG SCREEN     Drug Class       Cutoff (ng/mL)     Amphetamine      1000     Barbiturate      200     Benzodiazepine   200     Tricyclics       300     Opiates          300     Cocaine          300     THC              50  CBC     Status: None   Collection Time    11/16/12  6:32 AM      Result Value Range   WBC  4.7  4.0 - 10.5 K/uL   RBC 4.47  3.87 - 5.11 MIL/uL   Hemoglobin 13.9  12.0 - 15.0 g/dL   HCT 95.2  84.1 - 32.4 %   MCV 90.6  78.0 - 100.0 fL   MCH 31.1  26.0 - 34.0 pg   MCHC 34.3  30.0 - 36.0 g/dL   RDW 40.1  02.7 - 25.3 %   Platelets 248  150 - 400 K/uL  COMPREHENSIVE METABOLIC PANEL     Status: None   Collection Time    11/16/12  6:32 AM      Result Value Range   Sodium 138  135 - 145 mEq/L   Potassium 3.5  3.5 - 5.1 mEq/L   Chloride 100  96 - 112 mEq/L   CO2 30  19 - 32 mEq/L   Glucose, Bld 90  70 - 99 mg/dL   BUN 10  6 - 23 mg/dL   Creatinine, Ser 6.64  0.50 - 1.10 mg/dL   Calcium 9.4  8.4 - 40.3 mg/dL   Total Protein 7.7  6.0 - 8.3 g/dL   Albumin 4.1  3.5 - 5.2 g/dL   AST 21  0 - 37 U/L   ALT 14  0 - 35 U/L   Alkaline Phosphatase 74  39 - 117 U/L   Total Bilirubin 0.6  0.3 - 1.2 mg/dL   GFR calc non Af Amer >90  >90 mL/min   GFR calc Af Amer >90  >90 mL/min   Comment:            The eGFR has been calculated     using the CKD EPI equation.     This calculation has not been     validated in all clinical     situations.     eGFR's persistently     <90 mL/min signify     possible Chronic Kidney Disease.  ETHANOL     Status: None   Collection Time    11/16/12  6:32 AM      Result Value Range   Alcohol, Ethyl (B) <11  0 - 11 mg/dL   Comment:            LOWEST DETECTABLE LIMIT FOR     SERUM ALCOHOL IS 11 mg/dL     FOR MEDICAL PURPOSES ONLY  HEMOGLOBIN A1C     Status: None   Collection Time    11/16/12  6:32 AM      Result Value Range   Hemoglobin A1C 5.1  <5.7 %   Comment: (NOTE)                                                                               According to the ADA Clinical Practice Recommendations for 2011, when     HbA1c is used as a screening test:      >=6.5%   Diagnostic of Diabetes Mellitus               (if abnormal result is confirmed)     5.7-6.4%   Increased risk of developing Diabetes Mellitus     References:Diagnosis and  Classification of Diabetes Mellitus,Diabetes     Care,2011,34(Suppl 1):S62-S69 and Standards of Medical Care in  Diabetes - 2011,Diabetes Care,2011,34 (Suppl 1):S11-S61.   Mean Plasma Glucose 100  <117 mg/dL  HEPATIC FUNCTION PANEL     Status: None   Collection Time    11/16/12  6:32 AM      Result Value Range   Total Protein 7.6  6.0 - 8.3 g/dL   Albumin 4.1  3.5 - 5.2 g/dL   AST 21  0 - 37 U/L   ALT 14  0 - 35 U/L   Alkaline Phosphatase 74  39 - 117 U/L   Total Bilirubin 0.6  0.3 - 1.2 mg/dL   Bilirubin, Direct 0.1  0.0 - 0.3 mg/dL   Indirect Bilirubin 0.5  0.3 - 0.9 mg/dL  LIPID PANEL     Status: Abnormal   Collection Time    11/16/12  6:32 AM      Result Value Range   Cholesterol 214 (*) 0 - 200 mg/dL   Triglycerides 76  <161 mg/dL   HDL 97  >09 mg/dL   Total CHOL/HDL Ratio 2.2     VLDL 15  0 - 40 mg/dL   LDL Cholesterol 604 (*) 0 - 99 mg/dL   Comment:            Total Cholesterol/HDL:CHD Risk     Coronary Heart Disease Risk Table                         Men   Women      1/2 Average Risk   3.4   3.3      Average Risk       5.0   4.4      2 X Average Risk   9.6   7.1      3 X Average Risk  23.4   11.0                Use the calculated Patient Ratio     above and the CHD Risk Table     to determine the patient's CHD Risk.                ATP III CLASSIFICATION (LDL):      <100     mg/dL   Optimal      540-981  mg/dL   Near or Above                        Optimal      130-159  mg/dL   Borderline      191-478  mg/dL   High      >295     mg/dL   Very High  MAGNESIUM     Status: None   Collection Time    11/16/12  6:32 AM      Result Value Range   Magnesium 1.8  1.5 - 2.5 mg/dL  TSH     Status: None   Collection Time    11/16/12  6:32 AM      Result Value Range   TSH 1.093  0.350 - 4.500 uIU/mL    Physical Findings: AIMS: Facial and Oral Movements Muscles of Facial Expression: None, normal Lips and Perioral Area: None, normal Jaw: None,  normal Tongue: None, normal,Extremity Movements Upper (arms, wrists, hands, fingers): None, normal Lower (legs, knees, ankles, toes): None, normal, Trunk Movements Neck, shoulders, hips: None, normal, Overall Severity Severity of abnormal movements (highest score from questions above): None, normal Incapacitation due to abnormal  movements: None, normal Patient's awareness of abnormal movements (rate only patient's report): No Awareness, Dental Status Current problems with teeth and/or dentures?: No Does patient usually wear dentures?: No  CIWA:  CIWA-Ar Total: 0 COWS:  COWS Total Score: 3  Psychiatric Specialty Exam: See Psychiatric Specialty Exam and Suicide Risk Assessment completed by Attending Physician prior to discharge.  Discharge destination:  Home  Is patient on multiple antipsychotic therapies at discharge:  No   Has Patient had three or more failed trials of antipsychotic monotherapy by history:  No Recommended Plan for Multiple Antipsychotic Therapies:  N/A  Discharge Orders   Future Orders Complete By Expires     Activity as tolerated - No restrictions  As directed     Diet - low sodium heart healthy  As directed         Medication List    STOP taking these medications       ALPRAZolam 0.5 MG tablet  Commonly known as:  XANAX      TAKE these medications     Indication   citalopram 40 MG tablet  Commonly known as:  CELEXA  Take 1 tablet (40 mg total) by mouth daily.   Indication:  Depression     diphenhydrAMINE 25 mg capsule  Commonly known as:  BENADRYL  Take 25-50 mg by mouth at bedtime as needed for sleep.      gabapentin 100 MG capsule  Commonly known as:  NEURONTIN  Take 1 capsule (100 mg total) by mouth 3 (three) times daily.   Indication:  Neuropathic Pain     hydrOXYzine 25 MG tablet  Commonly known as:  ATARAX/VISTARIL  Take 1 tablet (25 mg total) by mouth every 6 (six) hours as needed for anxiety (or CIWA score </= 10).      ibuprofen 200  MG tablet  Commonly known as:  ADVIL,MOTRIN  Take 600 mg by mouth every 6 (six) hours as needed for pain or headache.      multivitamin with minerals Tabs  Take 1 tablet by mouth daily.   Indication:  vitamin deficiency     traZODone 50 MG tablet  Commonly known as:  DESYREL  Take 1 tablet (50 mg total) by mouth at bedtime and may repeat dose one time if needed.   Indication:  Trouble Sleeping           Follow-up Information   Follow up with [facility] On 11/21/2012. (Appointment with Barbette Merino at 10:30 AM on 4/10)    Contact information:   [address] [place], [place] [address]  PH [phone] FAX [phone]      Follow-up recommendations:  Activity:  As tolerated Diet:  Low-sodium heart healthy diet Continue to work a relapse prevention plan Comments:  Patient will continue her care at [facility]  Total Discharge Time:  Greater than 30 minutes.  SignedNanine Means, PMH-NP 11/18/2012, 2:04 PM

## 2012-11-18 NOTE — Progress Notes (Signed)
Adult Psychoeducational Group Note  Date:  11/18/2012 Time:  1:29 PM  Group Topic/Focus:  Self Care:   The focus of this group is to help patients understand the importance of self-care in order to improve or restore emotional, physical, spiritual, interpersonal, and financial health.  Participation Level:  Active  Participation Quality:  Appropriate, Attentive and Sharing  Affect:  Appropriate  Cognitive:  Alert and Appropriate  Insight: Appropriate  Engagement in Group:  Engaged  Modes of Intervention:  Discussion  Additional Comments:  Pt was appropriate and attentive while attending group. Pt shared that she does make time to see friends and also sends holiday cards. Pt also stated that she wants to improve her her social circle.   Sharyn Lull 11/18/2012, 1:29 PM

## 2012-11-18 NOTE — [facility] Suicide Risk Assessment (Signed)
Suicide Risk Assessment  Discharge Assessment     Demographic Factors:  Caucasian  Mental Status Per Nursing Assessment::   On Admission:     Current Mental Status by Physician: In full contact with reality. There are no suicidal ideas, plans or intent. Her mood is euthymic. Her affect is appropriate. She states she is ready to be discharged home today. She is more insightful in that she needs to re create herself as life as she knew it is not anymore. Admits that finding that her ex was with another woman got to her. States that she needs to let go. She also needs to find ways to deal with stress rather than drinking and escalating her use of Xanax.    Loss Factors: Loss of significant relationship  Historical Factors: NA  Risk Reduction Factors:   Responsible for children under 20 years of age, Sense of responsibility to family, Living with another person, especially a relative and Positive social support  Continued Clinical Symptoms:  Depression:   Comorbid alcohol abuse/dependence Alcohol/Substance Abuse/Dependencies  Cognitive Features That Contribute To Risk: None Identified   Suicide Risk:  Minimal: No identifiable suicidal ideation.  Patients presenting with no risk factors but with morbid ruminations; may be classified as minimal risk based on the severity of the depressive symptoms  Discharge Diagnoses:   AXIS I:  Major Depression recurrent, Anxiety Disorder NOS, Alcohol Abuse, Benzodiazepine Abuse AXIS II:  Deferred AXIS III:  History reviewed. No pertinent past medical history. AXIS IV:  problems with primary support group AXIS V:  61-70 mild symptoms  Plan Of Care/Follow-up recommendations:  Activity:  As tolerated Diet:  regular Follow up outpatient basis Is patient on multiple antipsychotic therapies at discharge:  No   Has Patient had three or more failed trials of antipsychotic monotherapy by history:  No  Recommended Plan for Multiple Antipsychotic  Therapies: N/A   Theressa Piedra A 11/18/2012, 2:19 PM

## 2012-11-18 NOTE — [facility] Group Notes (Signed)
[facility] LCSW Group Therapy  11/18/2012 4:37 PM  Type of Therapy:  Group Therapy  Participation Level:  Active  Participation Quality:  Attentive, Sharing and Supportive  Affect:  Appropriate  Cognitive:  Alert and Oriented  Insight:  Developing/Improving and Engaged  Engagement in Therapy:  Engaged  Modes of Intervention:  Clarification, Confrontation, Discussion, Education, Exploration, Limit-setting, Orientation, Problem-solving, Rapport Building, Dance movement psychotherapist, Socialization and Support  Summary of Progress/Problems: The topic for group today was overcoming obstacles.  Pt discussed overcoming obstacles and what this means for pt. Pt stated that her obstacle would be staying focused. Pt reported that historically she has pushed her daughter aside "because I worry so much". Pt stated that she now desires to spend more time with her daughter and communicate more with her son overall. Pt ended the session in a positive mood.   Haskel Khan 11/18/2012, 4:37 PM

## 2012-11-18 NOTE — Progress Notes (Signed)
Patient did not attend the evening speaker AA meeting. Pt had received medicine to help her go to the bathroom.

## 2012-11-18 NOTE — Progress Notes (Signed)
[facility] Adult Case Management Discharge Plan :  Will you be returning to the same living situation after discharge: yes At discharge, do you have transportation home? yes Do you have the ability to pay for your medications: yes  Release of information consent forms completed and in the chart;  Patient's signature needed at discharge.  Patient to Follow up at: Follow-up Information   Follow up with [facility] On 11/21/2012. (Appointment with Barbette Merino at 10:30 AM on 4/10)    Contact information:   [address] Edmonia Lynch [place], [place] [address]  PH [phone] FAX [phone]      Patient denies SI/HI:  Yes, denies both   Safety Planning and Suicide Prevention discussed: Yes with patient  Clide Dales 11/18/2012, 5:26 PM

## 2012-11-18 NOTE — [facility] Group Notes (Signed)
[facility] LCSW Aftercare Discharge Planning Group Note   11/18/2012 8:45 AM  Participation Quality:  Appropriate  Mood/Affect:  Appropriate  Depression Rating:  1  Anxiety Rating:  1  Thoughts of Suicide:  No  Will you contract for safety?  NA  Current AVH: None  Plan for Discharge/Comments:  Return home, attend AA meetings and follow up with therapist in [facility]  [employer]: Patient will drive herself as she drove here  Supports:  Family & friends  Dyane Dustman, Julious Payer

## 2012-11-18 NOTE — Progress Notes (Signed)
Adult Psychoeducational Group Note  Date:  11/18/2012 Time:  11:01 AM  Group Topic/Focus:  Coping With Mental Health Crisis:   The purpose of this group is to help patients identify strategies for coping with mental health crisis.  Group discusses possible causes of crisis and ways to manage them effectively.  Participation Level:  Active  Participation Quality:  Attentive  Affect:  Appropriate  Cognitive:  Alert  Insight: Good  Engagement in Group:  Engaged  Modes of Intervention:  Education and activity  Additional Comments:  Pt is here for substance abuse and participated in group.  Trent Theisen T 11/18/2012, 11:01 AM

## 2012-11-18 NOTE — [facility] Suicide Risk Assessment (Signed)
[facility] INPATIENT:  Family/Significant Other Suicide Prevention Education  Suicide Prevention Education:  Patient refused consent to contact anyone in support system to provide suicide prevention education. Writer provided suicide prevention education directly to patient; conversation included risk factors, warning signs and resources to contact for help. Mobile crisis services explained and contact card placed in chart for pt to receive at discharge. Carney Bern, LCSWA  11/18/2012 11:22 AM

## 2012-11-18 NOTE — Progress Notes (Signed)
Patient denies SI/HI, denies A/V hallucinations. Patient verbalizes understanding of discharge instructions, follow up care and prescriptions. Patient given all belongings from [facility] locker. Patient escorted out by staff, transported by self.  

## 2012-11-18 NOTE — Tx Team (Signed)
Interdisciplinary Treatment Plan Update (Adult)  Date: 11/18/2012  Time Reviewed: 10:21 AM   Progress in Treatment: Attending groups: Yes Participating in groups: Yes Taking medication as prescribed:  Yes Tolerating medication:  Yes Family/Significant othe contact made: No Patient understands diagnosis: Yes Discussing patient identified problems/goals with staff: Yes Medical problems stabilized or resolved:  Yes Denies suicidal/homicidal ideation: Yes Patient has not harmed self or Others: Yes  New problem(s) identified: None Identified  Discharge Plan or Barriers:  CSW is assessing for appropriate referral to therapist in [facility]  Additional comments: N/A  Reason for Continuation of Hospitalization: NA   Estimated length of stay: Discharge today  For review of initial/current patient goals, please see plan of care.  Attendees: Patient:  Heather Reilly   Family:     Physician:  Geoffery Lyons 11/18/2012 10:21 AM   Nursing:   Roswell Miners, RN 11/18/2012 10:21 AM   Clinical Social Worker Ronda Fairly 11/18/2012 10:21 AM   Other:  Berneice Heinrich, RN 11/18/2012 10:21 AM   Other:  Georgina Quint, [employer] Student 11/18/2012 10:21 AM   Other:     Other:      Scribe for Treatment Team:   Carney Bern, LCSWA  11/18/2012 10:21 AM

## 2012-11-19 NOTE — Progress Notes (Signed)
Patient ID: Heather Reilly, female   DOB: [birth year], 47 y.o.   MRN: [id number] Patient was admitted on Friday 4/4 after lunch and discharged Monday 4/7.  PSA was not completed before discharge  Carney Bern, Theresia Majors 11/19/2012

## 2012-11-21 NOTE — Progress Notes (Signed)
Patient Discharge Instructions:  After Visit Summary (AVS):   Faxed to:  11/21/12 Discharge Summary Note:   Faxed to:  11/21/12 Psychiatric Admission Assessment Note:   Faxed to:  11/21/12 Suicide Risk Assessment - Discharge Assessment:   Faxed to:  11/21/12 Faxed/Sent to the Next Level Care provider:  11/21/12 Faxed to [facility] @ [phone]  Jerelene Redden, 11/21/2012, 3:57 PM

## 2013-01-15 ENCOUNTER — Emergency Department: Payer: Self-pay | Admitting: Emergency Medicine

## 2013-01-15 LAB — URINALYSIS, COMPLETE
Bacteria: NONE SEEN
Leukocyte Esterase: NEGATIVE
Nitrite: NEGATIVE
Protein: NEGATIVE
RBC,UR: 44 /HPF (ref 0–5)
Specific Gravity: 1.008 (ref 1.003–1.030)
Squamous Epithelial: 1
WBC UR: <1 /HPF (ref 0–5)

## 2013-01-15 LAB — LIPASE, BLOOD: Lipase: 169 U/L (ref 73–393)

## 2013-01-15 LAB — CBC
HCT: 37.5 % (ref 35.0–47.0)
HGB: 12.8 g/dL (ref 12.0–16.0)
Platelet: 215 10*3/uL (ref 150–440)
RDW: 12.9 % (ref 11.5–14.5)
WBC: 5.6 10*3/uL (ref 3.6–11.0)

## 2013-01-15 LAB — COMPREHENSIVE METABOLIC PANEL
Alkaline Phosphatase: 70 U/L (ref 50–136)
BUN: 11 mg/dL (ref 7–18)
Calcium, Total: 8.9 mg/dL (ref 8.5–10.1)
Chloride: 107 mmol/L (ref 98–107)
Glucose: 104 mg/dL — ABNORMAL HIGH (ref 65–99)
Potassium: 3.8 mmol/L (ref 3.5–5.1)
SGOT(AST): 28 U/L (ref 15–37)
SGPT (ALT): 21 U/L (ref 12–78)

## 2013-03-23 ENCOUNTER — Emergency Department: Payer: Self-pay | Admitting: Emergency Medicine

## 2013-05-01 ENCOUNTER — Emergency Department: Payer: Self-pay | Admitting: Emergency Medicine

## 2013-07-29 ENCOUNTER — Inpatient Hospital Stay (HOSPITAL_COMMUNITY)
Admission: RE | Admit: 2013-07-29 | Discharge: 2013-08-04 | DRG: 885 | Disposition: A | Discharge status: Home / Self Care | Payer: BC Managed Care – PPO | Attending: Emergency Medicine | Admitting: Emergency Medicine

## 2013-07-29 ENCOUNTER — Encounter (HOSPITAL_COMMUNITY): Payer: Self-pay | Admitting: *Deleted

## 2013-07-29 DIAGNOSIS — F411 Generalized anxiety disorder: Secondary | ICD-10-CM | POA: Diagnosis present

## 2013-07-29 DIAGNOSIS — R55 Syncope and collapse: Secondary | ICD-10-CM

## 2013-07-29 DIAGNOSIS — Z79899 Other long term (current) drug therapy: Secondary | ICD-10-CM

## 2013-07-29 DIAGNOSIS — F32A Depression, unspecified: Secondary | ICD-10-CM | POA: Diagnosis present

## 2013-07-29 DIAGNOSIS — R45851 Suicidal ideations: Secondary | ICD-10-CM

## 2013-07-29 DIAGNOSIS — F1020 Alcohol dependence, uncomplicated: Secondary | ICD-10-CM | POA: Diagnosis present

## 2013-07-29 DIAGNOSIS — F331 Major depressive disorder, recurrent, moderate: Principal | ICD-10-CM | POA: Diagnosis present

## 2013-07-29 DIAGNOSIS — F329 Major depressive disorder, single episode, unspecified: Secondary | ICD-10-CM | POA: Diagnosis present

## 2013-07-29 DIAGNOSIS — F1320 Sedative, hypnotic or anxiolytic dependence, uncomplicated: Secondary | ICD-10-CM | POA: Diagnosis present

## 2013-07-29 DIAGNOSIS — I951 Orthostatic hypotension: Secondary | ICD-10-CM

## 2013-07-29 HISTORY — DX: Anxiety disorder, unspecified: F41.9

## 2013-07-29 HISTORY — DX: Major depressive disorder, single episode, unspecified: F32.9

## 2013-07-29 HISTORY — DX: Depression, unspecified: F32.A

## 2013-07-29 LAB — COMPREHENSIVE METABOLIC PANEL
ALT: 12 U/L (ref 0–35)
AST: 17 U/L (ref 0–37)
Alkaline Phosphatase: 87 U/L (ref 39–117)
CO2: 28 mEq/L (ref 19–32)
GFR calc Af Amer: >90 mL/min (ref >90)
Glucose, Bld: 97 mg/dL (ref 70–99)
Potassium: 4 mEq/L (ref 3.5–5.1)
Sodium: 138 mEq/L (ref 135–145)
Total Protein: 7.8 g/dL (ref 6.0–8.3)

## 2013-07-29 LAB — CBC
Hemoglobin: 14.1 g/dL (ref 12.0–15.0)
MCHC: 35.6 g/dL (ref 30.0–36.0)
Platelets: 267 10*3/uL (ref 150–400)
RBC: 4.53 MIL/uL (ref 3.87–5.11)

## 2013-07-29 MED ORDER — ACETAMINOPHEN 325 MG PO TABS: 650.0000 mg | ORAL_TABLET | Freq: Four times a day (QID) | ORAL | Status: DC | PRN

## 2013-07-29 MED ORDER — MAGNESIUM HYDROXIDE 400 MG/5ML PO SUSP: 30.0000 mL | Freq: Every day | ORAL | Status: DC | PRN

## 2013-07-29 MED ORDER — HYDROXYZINE HCL 50 MG PO TABS
50.0000 mg | ORAL_TABLET | Freq: Four times a day (QID) | ORAL | Status: DC | PRN
  Administered 2013-07-29 – 2013-08-03 (×3): 50 mg via ORAL
  Filled 2013-07-29 (×3): qty 1

## 2013-07-29 MED ORDER — LORAZEPAM 1 MG PO TABS
1.0000 mg | ORAL_TABLET | Freq: Four times a day (QID) | ORAL | Status: DC | PRN
  Administered 2013-07-29 – 2013-08-01 (×3): 1 mg via ORAL
  Filled 2013-07-29 (×3): qty 1

## 2013-07-29 MED ORDER — TRAZODONE HCL 50 MG PO TABS
50.0000 mg | ORAL_TABLET | Freq: Every evening | ORAL | Status: DC | PRN
  Administered 2013-07-29 – 2013-08-02 (×3): 50 mg via ORAL
  Filled 2013-07-29 (×3): qty 1
  Filled 2013-07-29: qty 8
  Filled 2013-07-29: qty 1
  Filled 2013-07-29: qty 8

## 2013-07-29 MED ORDER — ALUM & MAG HYDROXIDE-SIMETH 200-200-20 MG/5ML PO SUSP: 30.0000 mL | ORAL | Status: DC | PRN

## 2013-07-29 NOTE — Progress Notes (Addendum)
Patient walk in, admitted to [facility] this afternoon.  Last admission April 2014.  Patient depressed and anxious over not having a job, divorced finalized in 2012, shares custody of 49 yr old daughter with ex-husband.  No energy to cook, take care of home/family.  Court date August 26, 2013 for stealing groceries.  Not seen Dr. Gladys Damme in months.  Thoughts to drive head on in front of truck.  Averages one hour per night sleep.  Has been "taking xanax 0.5 mg to help knock me out at night.  Still don't sleep at night.  Xanax is not prescribed to me.  No energy, very depressed.  Need medication adjustment.  Need therapist for help on 1:1."  Denied THC/cocaine/substance abuse.  Smokes half pack cigarettes daily, started smoking 4 weeks ago.  Divorced with 2 children, age 17 yrs and 12 yrs.  Right hip scar old laceration healed. Drove herself to [facility].  Feels she has little family support.  Has had thoughts to "want to end it all, thoughts of ways to die but would not follow through."  Denied SI during admission, contracts for safety.  Denied HI.   Denied A/V hallucinations.  Denied pain.  Stated she was taking neurotin, geodon, trazadone at home but not for a while.  Stated she stays hungry all the time but feels she cannot eat, lost from 143 lbs. Down to 117 lbs.  Usually drinks one marguarita every 2 months.  Started drinking alcohol at age 18 yrs, has gone years without drinking alcohol.  Food/drink given patient.  Patient high fall risk, has recently fallen at home and ambulance was called, stated she was anemic.  Fall prevention information given and reviewed with patient who stated she understood and had no questions.  Locker 34, glasses, black pocketbook, 2 medication bottles of geodon, neurotin, black wallet with cards, DL, EBT card, [employer], visa card, insurance cards, etc.  Money $30.00 plus change.  [employer] pay ceck $284.56.  Black phone and charger, keys, writing pens and paper, court documents,  loop earrings one pair. Patient has been cooperative and pleasant during admission.    1845  Patient was medicated with ativan 1 mg prn for anxiety.  Patient stated she is about to lose her home to foreclosure.  Her son is a Runner, broadcasting/film/video who lives in [place], son is doing well.  Daughter is being cared for by in-laws who want her to succeed.  Patient stated she needs to go back to school so she will be able to find employment, provide for family, etc.

## 2013-07-29 NOTE — Progress Notes (Signed)
Pt is new to the [facility] hall this afternoon.  Pt reports she is here to "get herself straightened out".  She denies substance abuse, but says she was taking extra xanax to help her sleep.  She says she has had very little sleep for 2 weeks.  Pt reports she has a lot of stressors right now, but did not want to elaborate at this time.  Pt denies SI/HI/AV.  Her only concern to this writer is to get medication to help her go to sleep.  Informed pt that PA would be notified of pt's request.  Also discussed pt's meds that have been ordered.  Support and encouragement offered.  Safety maintained with q15 minute checks.

## 2013-07-29 NOTE — Tx Team (Signed)
Initial Interdisciplinary Treatment Plan  PATIENT STRENGTHS: (choose at least two) Ability for insight Average or above average intelligence Capable of independent living Communication skills General fund of knowledge Motivation for treatment/growth Physical Health Work skills  PATIENT STRESSORS: Financial difficulties Legal issue Medication change or noncompliance Occupational concerns Substance abuse   PROBLEM LIST: Problem List/Patient Goals Date to be addressed Date deferred Reason deferred Estimated date of resolution  Substance abuse 07/29/2013   D/c        Suicidal ideation 07/19/2013   D/c        depression 07/19/2013   D/c                           DISCHARGE CRITERIA:  Ability to meet basic life and health needs Adequate post-discharge living arrangements Improved stabilization in mood, thinking, and/or behavior Medical problems require only outpatient monitoring Motivation to continue treatment in a less acute level of care Need for constant or close observation no longer present Reduction of life-threatening or endangering symptoms to within safe limits Safe-care adequate arrangements made Verbal commitment to aftercare and medication compliance Withdrawal symptoms are absent or subacute and managed without 24-hour nursing intervention  PRELIMINARY DISCHARGE PLAN: Attend aftercare/continuing care group Attend PHP/IOP Attend 12-step recovery group Outpatient therapy Participate in family therapy Return to previous living arrangement Return to previous work or school arrangements  PATIENT/FAMIILY INVOLVEMENT: This treatment plan has been presented to and reviewed with the patient, Heather Reilly.  The patient and family have been given the opportunity to ask questions and make suggestions.  Earline Mayotte 07/29/2013, 5:34 PM

## 2013-07-29 NOTE — BH Assessment (Addendum)
Assessment Note  Heather Reilly is an 47 y.o. female. Pt goes by her middle name "Heather Reilly". Pt presents voluntarily to [facility] endorsing SI with increasing depressive symptoms. Pt sts she is thinking of driving head on into a truck. Pt's affect is depressed and she is cooperative. Pt endorses depressive symptoms include loss of interest in usual pleasures, decreased appetite w/ weight loss of 23 lbs., insomnia, isolating behavior and hopelessness. Pt sts she stays in her bedroom all day and has no energy to cook for her 31 yo daughter (pt shares custody of daughter w/ ex husband). She endorses severe anxiety. Pt sts current stressors include inability to find/keep job, grief over divorce which was finalized in 2012, depression and anxiety. Pt sts she sleeps approx. 1 hr a day. Pt sts she used to see Dr. Rogers Blocker at [facility] but hasn't been there in several mos. Pt sts she isn't taking her psych meds regularly. Pt says, "I feel like I have my lost my total self". And "I don't want to be here anymore". Pt sts she often thinks she would be better off dead. Pt sts she used to abuse Xanax which was why she was admitted to [facility] in April 2014 (her only inpatient admission). Pt sts she hasn't had any benzos for several months. Pt sts she rarely drinks. Pt sts she has court date Jan 13th for stealing groceries which she says is totally out of character for her.  Pt has been accepted by Serena Colonel NP to Dr. Dub Mikes [id number]. Pt's brother is Isaac Laud RN who works at [employer].   Axis I: Major Depressive Disorder, Recurrent, Severe without Psychotic Features            Benzodiazepine Use Disorder, Full Remission Axis II: Deferred Axis III: No past medical history on file. Axis IV: economic problems, occupational problems, other psychosocial or environmental problems and problems related to social environment Axis V: 31-40 impairment in reality testing  Past Medical History: No past medical history on file.  Past Surgical  History  Procedure Laterality Date  . No past surgeries      Family History: No family history on file.  Social History:  reports that she has never smoked. She has never used smokeless tobacco. She reports that she drinks alcohol. She reports that she uses illicit drugs (Benzodiazepines).  Additional Social History:  Alcohol / Drug Use Pain Medications: see PTA meds list Prescriptions: see PTA meds list Over the Counter: see PTA meds list History of alcohol / drug use?: Yes Substance #1 Name of Substance 1: Xanax 1 - Age of First Use: 47 yo 1 - Amount (size/oz): 1-2 mg 1 - Duration: hasn't had any since May 2014` 1 - Last Use / Amount: May 2014 Substance #2 Name of Substance 2: alcohol 2 - Age of First Use: 47 yo 2 - Amount (size/oz): varies 2 - Last Use / Amount: May 2014  CIWA:   COWS:    Allergies:  Allergies  Allergen Reactions  . Penicillins Hives and Itching  . Sulfa Antibiotics Hives and Itching    Home Medications:  Medications Prior to Admission  Medication Sig Dispense Refill  . citalopram (CELEXA) 40 MG tablet Take 1 tablet (40 mg total) by mouth daily.  30 tablet  0  . diphenhydrAMINE (BENADRYL) 25 mg capsule Take 25-50 mg by mouth at bedtime as needed for sleep.      Marland Kitchen gabapentin (NEURONTIN) 100 MG capsule Take 1 capsule (100 mg total)  by mouth 3 (three) times daily.  90 capsule  0  . hydrOXYzine (ATARAX/VISTARIL) 25 MG tablet Take 1 tablet (25 mg total) by mouth every 6 (six) hours as needed for anxiety (or CIWA score </= 10).  30 tablet  0  . ibuprofen (ADVIL,MOTRIN) 200 MG tablet Take 600 mg by mouth every 6 (six) hours as needed for pain or headache.      . Multiple Vitamin (MULTIVITAMIN WITH MINERALS) TABS Take 1 tablet by mouth daily.  30 tablet  0  . traZODone (DESYREL) 50 MG tablet Take 1 tablet (50 mg total) by mouth at bedtime and may repeat dose one time if needed.  30 tablet  0    OB/GYN Status:  Patient's last menstrual period was  11/15/2012.  General Assessment Data Location of Assessment: [facility] Assessment Services Is this a Tele or Face-to-Face Assessment?: Face-to-Face Is this an Initial Assessment or a Re-assessment for this encounter?: Initial Assessment Living Arrangements: Children Can pt return to current living arrangement?: Yes Admission Status: Voluntary Is patient capable of signing voluntary admission?: Yes Transfer from: Home Referral Source: Self/Family/Friend     [facility] Crisis Care Plan Living Arrangements: Children  Education Status Is patient currently in school?: No Highest grade of school patient has completed: 13  Risk to self Suicidal Ideation: Yes-Currently Present Suicidal Intent: Yes-Currently Present Is patient at risk for suicide?: Yes Suicidal Plan?: Yes-Currently Present Specify Current Suicidal Plan: to drive into a truck Access to Means: Yes Specify Access to Suicidal Means: car What has been your use of drugs/alcohol within the last 12 months?: rarely used alcohol Previous Attempts/Gestures: No How many times?: 0 Other Self Harm Risks: none Triggers for Past Attempts:  (none) Intentional Self Injurious Behavior: None Family Suicide History: No Recent stressful life event(s): Financial Problems;Divorce Persecutory voices/beliefs?: No Depression: Yes Depression Symptoms: Despondent;Insomnia;Isolating;Fatigue;Loss of interest in usual pleasures;Guilt;Feeling angry/irritable;Feeling worthless/self pity Substance abuse history and/or treatment for substance abuse?: Yes Suicide prevention information given to non-admitted patients: Not applicable  Risk to Others Homicidal Ideation: No Thoughts of Harm to Others: No Current Homicidal Intent: No Current Homicidal Plan: No Access to Homicidal Means: No Identified Victim: none History of harm to others?: No Assessment of Violence: None Noted Violent Behavior Description: none - pt calm during assessmt Does patient have  access to weapons?: No Criminal Charges Pending?: Yes Describe Pending Criminal Charges: stealing groceries Does patient have a court date: Yes Court Date: 08/26/13  Psychosis Hallucinations: None noted Delusions: None noted  Mental Status Report Appear/Hygiene: Other (Comment) (appropriate) Eye Contact: Good Motor Activity: Freedom of movement Speech: Logical/coherent Level of Consciousness: Alert Mood: Depressed;Anxious;Sad Affect: Appropriate to circumstance;Depressed;Sad Anxiety Level: Severe Thought Processes: Coherent;Relevant Judgement: Unimpaired Orientation: Person;Place;Time;Situation Obsessive Compulsive Thoughts/Behaviors: None  Cognitive Functioning Concentration: Normal Memory: Recent Intact;Remote Intact IQ: Average Insight: Good Impulse Control: Fair Appetite: Poor Weight Loss: 23 (since May) Sleep: Decreased Total Hours of Sleep: 1 (per night) Vegetative Symptoms: None  ADLScreening [facility]) Patient's cognitive ability adequate to safely complete daily activities?: Yes Patient able to express need for assistance with ADLs?: Yes Independently performs ADLs?: Yes (appropriate for developmental age)  Prior Inpatient Therapy Prior Inpatient Therapy: Yes Prior Therapy Dates: April 2014 Prior Therapy Facilty/Provider(s): [facility] Reason for Treatment: benzo abuse, depression, SI  Prior Outpatient Therapy Prior Outpatient Therapy: Yes Prior Therapy Dates: recently Prior Therapy Facilty/Provider(s): Dr. Rogers Blocker Reason for Treatment: depression, med management  ADL Screening (condition at time of admission) Patient's cognitive ability adequate to safely  complete daily activities?: Yes Is the patient deaf or have difficulty hearing?: No Does the patient have difficulty seeing, even when wearing glasses/contacts?: No Does the patient have difficulty concentrating, remembering, or making decisions?: No Patient able to express need for  assistance with ADLs?: Yes Does the patient have difficulty dressing or bathing?: No Independently performs ADLs?: Yes (appropriate for developmental age) Does the patient have difficulty walking or climbing stairs?: No Weakness of Legs: None Weakness of Arms/Hands: None       Abuse/Neglect Assessment (Assessment to be complete while patient is alone) Physical Abuse: Denies Verbal Abuse: Denies Sexual Abuse: Denies Exploitation of patient/patient's resources: Denies     Advance Directives (For Healthcare) Advance Directive: Patient does not have advance directive;Patient would not like information    Additional Information 1:1 In Past 12 Months?: No CIRT Risk: No Elopement Risk: No Does patient have medical clearance?: Yes     Disposition:  Disposition Initial Assessment Completed for this Encounter: Yes Disposition of Patient: Inpatient treatment program (pt accepted to [facility] by Serena Colonel to Dr. Dub Mikes)  On Site Evaluation by:   Reviewed with Physician:    Donnamarie Rossetti P 07/29/2013 3:00 PM

## 2013-07-30 ENCOUNTER — Encounter (HOSPITAL_COMMUNITY): Payer: Self-pay | Admitting: Psychiatry

## 2013-07-30 DIAGNOSIS — F411 Generalized anxiety disorder: Secondary | ICD-10-CM

## 2013-07-30 DIAGNOSIS — F1310 Sedative, hypnotic or anxiolytic abuse, uncomplicated: Secondary | ICD-10-CM

## 2013-07-30 DIAGNOSIS — F1994 Other psychoactive substance use, unspecified with psychoactive substance-induced mood disorder: Secondary | ICD-10-CM

## 2013-07-30 DIAGNOSIS — F10229 Alcohol dependence with intoxication, unspecified: Secondary | ICD-10-CM

## 2013-07-30 LAB — URINALYSIS, ROUTINE W REFLEX MICROSCOPIC
Bilirubin Urine: NEGATIVE
Glucose, UA: NEGATIVE mg/dL
Hgb urine dipstick: NEGATIVE
Ketones, ur: NEGATIVE mg/dL
Nitrite: NEGATIVE
Specific Gravity, Urine: 1.018 (ref 1.005–1.030)
pH: 7 (ref 5.0–8.0)

## 2013-07-30 LAB — RAPID URINE DRUG SCREEN, HOSP PERFORMED
Amphetamines: NOT DETECTED
Barbiturates: NOT DETECTED
Benzodiazepines: POSITIVE — AB
Cocaine: NOT DETECTED
Tetrahydrocannabinol: NOT DETECTED

## 2013-07-30 LAB — URINE MICROSCOPIC-ADD ON

## 2013-07-30 MED ORDER — QUETIAPINE FUMARATE 50 MG PO TABS
50.0000 mg | ORAL_TABLET | Freq: Every evening | ORAL | Status: DC | PRN
  Administered 2013-07-30 – 2013-07-31 (×2): 50 mg via ORAL
  Filled 2013-07-30 (×2): qty 1

## 2013-07-30 MED ORDER — VILAZODONE HCL 10 MG PO TABS
10.0000 mg | ORAL_TABLET | Freq: Every day | ORAL | Status: DC
  Administered 2013-07-30 – 2013-08-03 (×5): 10 mg via ORAL
  Filled 2013-07-30 (×7): qty 1

## 2013-07-30 MED ORDER — ENSURE COMPLETE PO LIQD
237.0000 mL | Freq: Two times a day (BID) | ORAL | Status: DC
  Administered 2013-07-31 – 2013-08-04 (×4): 237 mL via ORAL
  Filled 2013-07-30: qty 237
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
## 2013-07-30 NOTE — BHH Suicide Risk Assessment (Signed)
[facility] Adult Inpatient Family/Significant Other Suicide Prevention Education  Suicide Prevention Education:   Patient Refusal for Family/Significant Other Suicide Prevention Education: The patient has refused to provide written consent for family/significant other to be provided Family/Significant Other Suicide Prevention Education during admission and/or prior to discharge.  Physician notified.  CSW provided suicide prevention information with patient.    The suicide prevention education provided includes the following:  Suicide risk factors  Suicide prevention and interventions  National Suicide Hotline telephone number  [facility] assessment telephone number  [facility] Emergency Assistance 911  [facility] and/or Residential Mobile Crisis Unit telephone number   Heather Reilly, [place] 07/30/2013 2:22 PM

## 2013-07-30 NOTE — Progress Notes (Signed)
NUTRITION ASSESSMENT  Pt identified as at risk on the Malnutrition Screen Tool  INTERVENTION: 1. Educated patient on the importance of nutrition and encouraged intake of food and beverages. 2. Discussed weight goals. 3. Supplements: Ensure Complete po BID, each supplement provides 350 kcal and 13 grams of protein   NUTRITION DIAGNOSIS: Unintentional weight loss related to sub-optimal intake as evidenced by pt report.   Goal: Pt to meet >/= 90% of their estimated nutrition needs.  Monitor:  PO intake  Assessment:  Patient admitted with stress and SI.  Patient noted to have had a 17 lb weight loss in the past 9 months.  UBW of 138 lbs.  Patient reports poor intake for the past 3-4 months.  Fair intake currently.  Receptive to protein supplements.  47 y.o. female  Height: Ht Readings from Last 1 Encounters:  07/29/13 5' 2.5" (1.588 m)    Weight: Wt Readings from Last 1 Encounters:  07/29/13 119 lb 8 oz (54.205 kg)    Weight Hx: Wt Readings from Last 10 Encounters:  07/29/13 119 lb 8 oz (54.205 kg)  11/15/12 132 lb (59.875 kg)    BMI:  Body mass index is 21.5 kg/(m^2). Pt meets criteria for wnl based on current BMI.  Estimated Nutritional Needs: Kcal: 25-30 kcal/kg Protein: > 1 gram protein/kg Fluid: 1 ml/kcal  Diet Order: General Pt is also offered choice of unit snacks mid-morning and mid-afternoon.  Pt is eating as desired.   Lab results and medications reviewed.   Oran Rein, RD, LDN Clinical Inpatient Dietitian Pager:  [phone] Weekend and after hours pager:  [phone]

## 2013-07-30 NOTE — Progress Notes (Signed)
Pt attended NA group and participated throughout the group  

## 2013-07-30 NOTE — BHH Group Notes (Signed)
BHH LCSW Group Therapy  07/30/2013  1:15 PM   Type of Therapy:  Group Therapy  Participation Level:  Active  Participation Quality:  Attentive, Sharing and Supportive  Affect:  Depressed and Flat  Cognitive:  Alert and Oriented  Insight:  Developing/Improving and Engaged  Engagement in Therapy:  Developing/Improving and Engaged  Modes of Intervention:  Clarification, Confrontation, Discussion, Education, Exploration, Limit-setting, Orientation, Problem-solving, Rapport Building, Dance movement psychotherapist, Socialization and Support  Summary of Progress/Problems: The topic for group today was emotional regulation.  This group focused on both positive and negative emotion identification and allowed group members to process ways to identify feelings, regulate negative emotions, and find healthy ways to manage internal/external emotions. Group members were asked to reflect on a time when their reaction to an emotion led to a negative outcome and explored how alternative responses using emotion regulation would have benefited them. Group members were also asked to discuss a time when emotion regulation was utilized when a negative emotion was experienced. Pt shared that she was dealing with emotions of jealousy, hurt and sadness from losing her job and her husband leaving her.  Pt states that she was having a "pity party" and felt stuck, so she sat around the house depressed.  Pt states that she felt worthless, hopeless and helpless.  Pt states that getting a job would help build her self esteem again.  Pt actively participated and was engaged in group discussion.    Reyes Ivan, LCSW 07/30/2013 2:25 PM
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
## 2013-07-30 NOTE — Tx Team (Signed)
Interdisciplinary Treatment Plan Update (Adult)  Date: 07/30/2013  Time Reviewed:  9:45 AM  Progress in Treatment: Attending groups: Yes Participating in groups:  Yes Taking medication as prescribed:  Yes Tolerating medication:  Yes Family/Significant othe contact made: CSW assessing Patient understands diagnosis:  Yes Discussing patient identified problems/goals with staff:  Yes Medical problems stabilized or resolved:  Yes Denies suicidal/homicidal ideation: Yes Issues/concerns per patient self-inventory:  Yes Other:  New problem(s) identified: N/A  Discharge Plan or Barriers: CSW assessing for appropriate referrals.    Reason for Continuation of Hospitalization: Anxiety Depression Medication Stabilization  Comments: N/A  Estimated length of stay: 3-5 days  For review of initial/current patient goals, please see plan of care.  Attendees: Patient:     Family:     Physician:  Dr. Dub Mikes 07/30/2013 10:26 AM   Nursing:   Robbie Louis, RN 07/30/2013 10:26 AM   Clinical Social Worker:  Reyes Ivan, LCSW 07/30/2013 10:26 AM   Other: Onnie Boer, RN case manager 07/30/2013 10:26 AM   Other:  Trula Slade, LCSWA 07/30/2013 10:26 AM   Other:  Serena Colonel, NP 07/30/2013 10:26 AM   Other:  Roswell Miners, RN 07/30/2013 10:26 AM   Other:    Other:    Other:    Other:    Other:    Other:     Scribe for Treatment Team:   Carmina Miller, 07/30/2013 , 10:26 AM

## 2013-07-30 NOTE — BHH Group Notes (Signed)
[facility] LCSW Aftercare Discharge Planning Group Note   07/30/2013 8:45 AM  Participation Quality:  Alert, Appropriate and Oriented  Mood/Affect:  Flat and Depressed  Depression Rating:  7  Anxiety Rating:  8  Thoughts of Suicide:  Pt denies SI/HI  Will you contract for safety?   Yes  Current AVH:  Pt denies  Plan for Discharge/Comments:  Pt attended discharge planning group and actively participated in group.  CSW provided pt with today's workbook.  Pt reports coming to the hospital for depression.  Pt states that she was not able to function at home.  Pt lives in [place] with her daughter and can return home there.  Pt states that she has been to [facility] for medication management in the past but wants to change providers.  CSW will assess for appropriate referrals.  No further needs voiced by pt at this time.    Transportation Means: Pt reports access to transportation - pt has own car here  Supports: No supports mentioned at this time  Heather Ivan, LCSW 07/30/2013 10:02 AM

## 2013-07-30 NOTE — [facility] Suicide Risk Assessment (Signed)
Suicide Risk Assessment  Admission Assessment     Nursing information obtained from:  Patient Demographic factors:  Divorced or widowed;Caucasian;Low socioeconomic status Current Mental Status:    Loss Factors:  Decrease in vocational status;Loss of significant relationship;Legal issues;Financial problems / change in socioeconomic status Historical Factors:  Prior suicide attempts;Family history of mental illness or substance abuse;Impulsivity Risk Reduction Factors:  Responsible for children under 61 years of age;Sense of responsibility to family;Living with another person, especially a relative  CLINICAL FACTORS:   Depression:   Comorbid alcohol abuse/dependence Alcohol/Substance Abuse/Dependencies  COGNITIVE FEATURES THAT CONTRIBUTE TO RISK:  Closed-mindedness Polarized thinking Thought constriction (tunnel vision)    SUICIDE RISK:   Moderate:  Frequent suicidal ideation with limited intensity, and duration, some specificity in terms of plans, no associated intent, good self-control, limited dysphoria/symptomatology, some risk factors present, and identifiable protective factors, including available and accessible social support.  PLAN OF CARE: Supportive approach/coping skills/relapse prevention                               Identify detox needs                               Identify and address the co morbidities  I certify that inpatient services furnished can reasonably be expected to improve the patient's condition.  Heather Reilly A 07/30/2013, 5:57 PM

## 2013-07-30 NOTE — Progress Notes (Signed)
Adult Psychoeducational Group Note  Date:  07/30/2013 Time:  11:00AM Group Topic/Focus:  Personal Development  Participation Level:  Active  Participation Quality:  Appropriate and Attentive  Affect:  Appropriate  Cognitive:  Alert and Appropriate  Insight: Appropriate  Engagement in Group:  Engaged  Modes of Intervention:  Discussion  Pt. Was attentive and appropriate during today's group discussion. Pt was able to discuss predator-Master or Illusion and identify values.  Additional Comments:    April Manson 07/30/2013, 1:06 PM

## 2013-07-30 NOTE — Progress Notes (Signed)
Pt reported she had no sleep last night and she had written about 2 pages of her symptoms and wanted to shared with Dr. Dub Mikes this morning.  She rated all her depression, hopelessness and anxiety 7-8 and stated, "I will continue to feel this way until my mental distress is under control"  She was started on Viibrid 10 mg today.  She denies any S/H ideation or A/V/H.  She has been up and active in the milieu today.  She will also have seroquel 50 mg with a MRx1 at bedtime tonight.

## 2013-07-30 NOTE — Progress Notes (Signed)
D: Patient denies SI/Hi/AVH.  Pt. Has appears anxious with a flat affect.  Pt. States that she is here because she cannot sleep at night. Pt. Reports that despite medication she could not sleep last night.  Pt. Received new orders for medication tonight.   A: Patient given emotional support from RN. Patient encouraged to come to staff with concerns and/or questions. Patient's medication routine continued. Patient's orders and plan of care reviewed.   R: Patient remains appropriate and cooperative. Will continue to monitor patient q15 minutes for safety.

## 2013-07-31 ENCOUNTER — Inpatient Hospital Stay (HOSPITAL_COMMUNITY): Payer: BC Managed Care – PPO

## 2013-07-31 ENCOUNTER — Encounter (HOSPITAL_COMMUNITY): Payer: Self-pay | Admitting: Emergency Medicine

## 2013-07-31 DIAGNOSIS — F321 Major depressive disorder, single episode, moderate: Secondary | ICD-10-CM

## 2013-07-31 LAB — CBC WITH DIFFERENTIAL/PLATELET
Basophils Relative: 1 % (ref 0–1)
Eosinophils Absolute: 0.1 10*3/uL (ref 0.0–0.7)
Eosinophils Relative: 2 % (ref 0–5)
HCT: 35.1 % — ABNORMAL LOW (ref 36.0–46.0)
Hemoglobin: 12.1 g/dL (ref 12.0–15.0)
Lymphocytes Relative: 25 % (ref 12–46)
Lymphs Abs: 1.8 10*3/uL (ref 0.7–4.0)
MCH: 30.4 pg (ref 26.0–34.0)
MCV: 88.2 fL (ref 78.0–100.0)
Monocytes Absolute: 0.5 10*3/uL (ref 0.1–1.0)
Monocytes Relative: 7 % (ref 3–12)
RBC: 3.98 MIL/uL (ref 3.87–5.11)
WBC: 7.4 10*3/uL (ref 4.0–10.5)

## 2013-07-31 LAB — BASIC METABOLIC PANEL
BUN: 14 mg/dL (ref 6–23)
CO2: 26 mEq/L (ref 19–32)
Calcium: 9.2 mg/dL (ref 8.4–10.5)
Creatinine, Ser: 0.74 mg/dL (ref 0.50–1.10)
GFR calc non Af Amer: >90 mL/min (ref >90)
Glucose, Bld: 115 mg/dL — ABNORMAL HIGH (ref 70–99)

## 2013-07-31 MED ORDER — SODIUM CHLORIDE 0.9 % IV SOLN
Freq: Once | INTRAVENOUS | Status: DC
  Filled 2013-07-31: qty 1000

## 2013-07-31 MED ORDER — QUETIAPINE FUMARATE 100 MG PO TABS
100.0000 mg | ORAL_TABLET | Freq: Every day | ORAL | Status: DC
  Administered 2013-07-31 – 2013-08-02 (×3): 100 mg via ORAL
  Filled 2013-07-31 (×6): qty 1

## 2013-07-31 NOTE — Progress Notes (Signed)
Adult Psychoeducational Group Note  Date:  07/31/2013 Time:  11:00AM Group Topic/Focus:  Leisure and Lifestyle Changes  Participation Level:  Did Not Attend  Additional Comments:  Pt. Didn't attend group.   Bing Plume D 07/31/2013, 2:02 PM

## 2013-07-31 NOTE — Progress Notes (Signed)
Pt came to medication window at this time and while she was waiting for her medication she dropped to floor.  She did not lose consciousness.  She stated,"I saw bright light then you" She did not hit her head or report any injuries.  Spoke with Dr. Dub Mikes he looked at pt and decision made to send her to [facility] for IV fluids and  Medical clearance.  She admitted to eating poorly and not drinking enough water.  She did not want any family called.

## 2013-07-31 NOTE — Progress Notes (Signed)
The focus of this group is to educate the patient on the purpose and policies of crisis stabilization and provide a format to answer questions about their admission.  The group details unit policies and expectations of patients while admitted. ° °Pt did not attend. °

## 2013-07-31 NOTE — ED Notes (Signed)
[facility] called to return pt to [facility]

## 2013-07-31 NOTE — ED Notes (Signed)
Pt refused to have CT scan done states that she is fine and that she did not feel it was nessary to have a ct scan done. Reported to MD Pollina. CT scan was cancelled per pt request.

## 2013-07-31 NOTE — Progress Notes (Addendum)
Pt back from [facility] and went to cafeteria for lunch her vital signs are stable and are being documented as ordered.  She denies any pain or discomfort.  She is hoping to go home tomorrow and come back here for IOP.  She still rates her depression, hopelessness and anxiety a 7-8 on her self-inventory and denies any S/H ideation.

## 2013-07-31 NOTE — ED Notes (Signed)
Spoke with Spero Curb at [facility] that pt is retuning.

## 2013-07-31 NOTE — [facility] Group Notes (Signed)
[facility] LCSW Group Therapy  07/31/2013 3:44 PM  Type of Therapy:  Group Therapy  Participation Level:  Active  Participation Quality:  Attentive  Affect:  Depressed and Flat  Cognitive:  Alert and Oriented  Insight:  Engaged  Engagement in Therapy:  Engaged  Modes of Intervention:  Confrontation, Discussion, Education, Exploration, Problem-solving, Rapport Building, Socialization and Support  Summary of Progress/Problems:  Finding Balance in Life. Today's group focused on defining balance in one's own words, identifying things that can knock one off balance, and exploring healthy ways to maintain balance in life. Group members were asked to provide an example of a time when they felt off balance, describe how they handled that situation,and process healthier ways to regain balance in the future. Group members were asked to share the most important tool for maintaining balance that they learned while at [facility] and how they plan to apply this method after discharge. Donyae was attentive and engaged throughout today's group although seh presented with depressed mood and flat affect. Nicki stated that to her balance meant "having an 8-5 job, providing for me and my family, pulling myself up out of this hole of depression by seeking help." Caleigha shows progress in the group setting and improving insight AEB her ability to actively participate in group discussion and process how her depressive symptoms contributed to her falling off balance. She stated that she plans to attend IOP for depression and take meds as prescribed at d/c in an effort to try something new and be more proactive in taking care of herself.    Smart, HeatherLCSWA  07/31/2013, 3:44 PM

## 2013-07-31 NOTE — Progress Notes (Signed)
[facility] MD Progress Note  07/31/2013 4:56 PM Heather Reilly  MRN:  [id number] Subjective: Seth experienced some lightheadness this AM. She was sent to the ED for hydration. She states she does not feel comfortable being here. She states since she was here last time she has not been abusing pills. She is mostly depressed because all her circumstances. She is afraid to be out there as states that she does not know how she is going to react. Before she came here she was staying in bed, unable to get up, paralyzed with anxiety, fear, doing and saying all these out of character things. She is concerned about finances, if she is going to be able to keep her house, her insurance. Diagnosis:   DSM5: Schizophrenia Disorders:  none Obsessive-Compulsive Disorders:  noe Trauma-Stressor Disorders:  none Substance/Addictive Disorders:  benzodiazepine abuse Depressive Disorders:  Major Depressive Disorder - Moderate (296.22)  Axis I: Anxiety Disorder NOS  ADL's:  Intact  Sleep: Fair  Appetite:  Fair  Suicidal Ideation:  Plan:  denies Intent:  denies Means:  denies Homicidal Ideation:  Plan:  denies Intent:  denies Means:  denies AEB (as evidenced by):  Psychiatric Specialty Exam: Review of Systems  Constitutional: Positive for malaise/fatigue.  HENT: Negative.   Eyes: Negative.   Respiratory: Negative.   Cardiovascular: Negative.   Gastrointestinal: Negative.   Genitourinary: Negative.   Musculoskeletal: Negative.   Skin: Negative.   Neurological: Positive for weakness.  Endo/Heme/Allergies: Negative.   Psychiatric/Behavioral: Positive for depression and substance abuse. The patient is nervous/anxious.     Blood pressure 102/65, pulse 85, temperature 98.1 F (36.7 C), temperature source Oral, resp. rate 19, height 5' 2.5" (1.588 m), weight 54.205 kg (119 lb 8 oz), last menstrual period 05/14/2013, SpO2 96.00%.Body mass index is 21.5 kg/(m^2).  General Appearance: Fairly Groomed  Proofreader::  Fair  Speech:  Clear and Coherent  Volume:  Decreased  Mood:  Anxious and sad, worried  Affect:  anxious, sad, worried  Thought Process:  Coherent and Goal Directed  Orientation:  Full (Time, Place, and Person)  Thought Content:  worries, concerns  Suicidal Thoughts:  No  Homicidal Thoughts:  No  Memory:  Immediate;   Fair Recent;   Fair Remote;   Fair  Judgement:  Fair  Insight:  Present  Psychomotor Activity:  Restlessness  Concentration:  Fair  Recall:  Fair  Akathisia:  No  Handed:    AIMS (if indicated):     Assets:  Desire for Improvement Housing Social Support  Sleep:  Number of Hours: 5   Current Medications: Current Facility-Administered Medications  Medication Dose Route Frequency Provider Last Rate Last Dose  . acetaminophen (TYLENOL) tablet 650 mg  650 mg Oral Q6H PRN Rachael Fee, MD      . alum & mag hydroxide-simeth (MAALOX/MYLANTA) 200-200-20 MG/5ML suspension 30 mL  30 mL Oral Q4H PRN Rachael Fee, MD      . feeding supplement (ENSURE COMPLETE) (ENSURE COMPLETE) liquid 237 mL  237 mL Oral BID BM Jeoffrey Massed, RD   237 mL at 07/31/13 0842  . hydrOXYzine (ATARAX/VISTARIL) tablet 50 mg  50 mg Oral Q6H PRN Kerry Hough, PA-C   50 mg at 07/29/13 2313  . LORazepam (ATIVAN) tablet 1 mg  1 mg Oral Q6H PRN Rachael Fee, MD   1 mg at 07/30/13 0104  . magnesium hydroxide (MILK OF MAGNESIA) suspension 30 mL  30 mL Oral Daily PRN Reymundo Poll  Dub Mikes, MD      . QUEtiapine (SEROQUEL) tablet 100 mg  100 mg Oral QHS Rachael Fee, MD      . traZODone (DESYREL) tablet 50 mg  50 mg Oral QHS PRN Rachael Fee, MD   50 mg at 07/30/13 0104  . Vilazodone HCl (VIIBRYD) TABS 10 mg  10 mg Oral Daily Rachael Fee, MD   10 mg at 07/31/13 5366    Lab Results:  Results for orders placed during the hospital encounter of 07/29/13 (from the past 48 hour(s))  CBC     Status: None   Collection Time    07/29/13  8:16 PM      Result Value Range   WBC 6.9  4.0 - 10.5 K/uL    RBC 4.53  3.87 - 5.11 MIL/uL   Hemoglobin 14.1  12.0 - 15.0 g/dL   HCT 44.0  34.7 - 42.5 %   MCV 87.4  78.0 - 100.0 fL   MCH 31.1  26.0 - 34.0 pg   MCHC 35.6  30.0 - 36.0 g/dL   RDW 95.6  38.7 - 56.4 %   Platelets 267  150 - 400 K/uL   Comment: Performed at [facility]  COMPREHENSIVE METABOLIC PANEL     Status: None   Collection Time    07/29/13  8:16 PM      Result Value Range   Sodium 138  135 - 145 mEq/L   Potassium 4.0  3.5 - 5.1 mEq/L   Chloride 101  96 - 112 mEq/L   CO2 28  19 - 32 mEq/L   Glucose, Bld 97  70 - 99 mg/dL   BUN 10  6 - 23 mg/dL   Creatinine, Ser 3.32  0.50 - 1.10 mg/dL   Calcium 95.1  8.4 - 88.4 mg/dL   Total Protein 7.8  6.0 - 8.3 g/dL   Albumin 4.2  3.5 - 5.2 g/dL   AST 17  0 - 37 U/L   ALT 12  0 - 35 U/L   Alkaline Phosphatase 87  39 - 117 U/L   Total Bilirubin 0.3  0.3 - 1.2 mg/dL   GFR calc non Af Amer >90  >90 mL/min   GFR calc Af Amer >90  >90 mL/min   Comment: (NOTE)     The eGFR has been calculated using the CKD EPI equation.     This calculation has not been validated in all clinical situations.     eGFR's persistently <90 mL/min signify possible Chronic Kidney     Disease.     Performed at [facility]  URINALYSIS, ROUTINE W REFLEX MICROSCOPIC     Status: Abnormal   Collection Time    07/29/13 10:14 PM      Result Value Range   Color, Urine YELLOW  YELLOW   APPearance CLOUDY (*) CLEAR   Specific Gravity, Urine 1.018  1.005 - 1.030   pH 7.0  5.0 - 8.0   Glucose, UA NEGATIVE  NEGATIVE mg/dL   Hgb urine dipstick NEGATIVE  NEGATIVE   Bilirubin Urine NEGATIVE  NEGATIVE   Ketones, ur NEGATIVE  NEGATIVE mg/dL   Protein, ur NEGATIVE  NEGATIVE mg/dL   Urobilinogen, UA 1.0  0.0 - 1.0 mg/dL   Nitrite NEGATIVE  NEGATIVE   Leukocytes, UA SMALL (*) NEGATIVE   Comment: Performed at [facility]  URINE RAPID DRUG SCREEN (HOSP PERFORMED)     Status: Abnormal  Collection Time    07/29/13 10:14  PM      Result Value Range   Opiates NONE DETECTED  NONE DETECTED   Cocaine NONE DETECTED  NONE DETECTED   Benzodiazepines POSITIVE (*) NONE DETECTED   Amphetamines NONE DETECTED  NONE DETECTED   Tetrahydrocannabinol NONE DETECTED  NONE DETECTED   Barbiturates NONE DETECTED  NONE DETECTED   Comment:            DRUG SCREEN FOR MEDICAL PURPOSES     ONLY.  IF CONFIRMATION IS NEEDED     FOR ANY PURPOSE, NOTIFY LAB     WITHIN 5 DAYS.                LOWEST DETECTABLE LIMITS     FOR URINE DRUG SCREEN     Drug Class       Cutoff (ng/mL)     Amphetamine      1000     Barbiturate      200     Benzodiazepine   200     Tricyclics       300     Opiates          300     Cocaine          300     THC              50     Performed at [facility]  URINE MICROSCOPIC-ADD ON     Status: Abnormal   Collection Time    07/29/13 10:14 PM      Result Value Range   Squamous Epithelial / LPF RARE  RARE   WBC, UA 0-2  <3 WBC/hpf   RBC / HPF 0-2  <3 RBC/hpf   Bacteria, UA MANY (*) RARE   Urine-Other MUCOUS PRESENT     Comment: AMORPHOUS URATES/PHOSPHATES     Performed at [facility]  CBC WITH DIFFERENTIAL     Status: Abnormal   Collection Time    07/31/13 10:00 AM      Result Value Range   WBC 7.4  4.0 - 10.5 K/uL   RBC 3.98  3.87 - 5.11 MIL/uL   Hemoglobin 12.1  12.0 - 15.0 g/dL   HCT 16.1 (*) 09.6 - 04.5 %   MCV 88.2  78.0 - 100.0 fL   MCH 30.4  26.0 - 34.0 pg   MCHC 34.5  30.0 - 36.0 g/dL   RDW 40.9  81.1 - 91.4 %   Platelets 211  150 - 400 K/uL   Neutrophils Relative % 66  43 - 77 %   Neutro Abs 4.9  1.7 - 7.7 K/uL   Lymphocytes Relative 25  12 - 46 %   Lymphs Abs 1.8  0.7 - 4.0 K/uL   Monocytes Relative 7  3 - 12 %   Monocytes Absolute 0.5  0.1 - 1.0 K/uL   Eosinophils Relative 2  0 - 5 %   Eosinophils Absolute 0.1  0.0 - 0.7 K/uL   Basophils Relative 1  0 - 1 %   Basophils Absolute 0.0  0.0 - 0.1 K/uL  BASIC METABOLIC PANEL     Status: Abnormal    Collection Time    07/31/13 10:00 AM      Result Value Range   Sodium 139  135 - 145 mEq/L   Potassium 3.8  3.5 - 5.1 mEq/L   Chloride 104  96 - 112 mEq/L  CO2 26  19 - 32 mEq/L   Glucose, Bld 115 (*) 70 - 99 mg/dL   BUN 14  6 - 23 mg/dL   Creatinine, Ser 4.09  0.50 - 1.10 mg/dL   Calcium 9.2  8.4 - 81.1 mg/dL   GFR calc non Af Amer >90  >90 mL/min   GFR calc Af Amer >90  >90 mL/min   Comment: (NOTE)     The eGFR has been calculated using the CKD EPI equation.     This calculation has not been validated in all clinical situations.     eGFR's persistently <90 mL/min signify possible Chronic Kidney     Disease.    Physical Findings: AIMS: Facial and Oral Movements Muscles of Facial Expression: None, normal Lips and Perioral Area: None, normal Jaw: None, normal Tongue: None, normal,Extremity Movements Upper (arms, wrists, hands, fingers): None, normal Lower (legs, knees, ankles, toes): None, normal, Trunk Movements Neck, shoulders, hips: None, normal, Overall Severity Severity of abnormal movements (highest score from questions above): None, normal Incapacitation due to abnormal movements: None, normal Patient's awareness of abnormal movements (rate only patient's report): No Awareness, Dental Status Current problems with teeth and/or dentures?: No Does patient usually wear dentures?: No  CIWA:  CIWA-Ar Total: 4 COWS:  COWS Total Score: 2  Treatment Plan Summary: Daily contact with patient to assess and evaluate symptoms and progress in treatment Medication management  Plan: Supportive approach/coping skills/relapse prevention           Reassess and optimize treatment with psychotropics           Change the Seroquel to 100 mg HS (had to take the second one at 12:00 last night)  Medical Decision Making Problem Points:  Review of psycho-social stressors (1) Data Points:  Review of new medications or change in dosage (2)  I certify that inpatient services furnished can  reasonably be expected to improve the patient's condition.   Jcion Buddenhagen A 07/31/2013, 4:56 PM

## 2013-07-31 NOTE — ED Provider Notes (Signed)
CSN: [id number]     Arrival date & time 07/31/13  5784 History   First MD Initiated Contact with Patient 07/31/13 [phone]     Chief Complaint  Patient presents with  . Loss of Consciousness   (Consider location/radiation/quality/duration/timing/severity/associated sxs/prior Treatment) HPI Comments: Patient [facility] from [facility] after a syncopal episode. Patient reports that she was sitting, stood up and felt dizzy. She says that her vision then slowly blacked out and she then passed out. Some was behind her and caught her, no fall or injury. Patient did not have any seizure-like activity. Upon arrival to the ER she is now back to her normal baseline. She denies dizziness, headache, chest pain, recent illness. Vital signs taken at the time of the syncopal episode we will blood pressure 88/53.  Patient is a 47 y.o. female presenting with syncope.  Loss of Consciousness   Past Medical History  Diagnosis Date  . Anxiety   . Depression    Past Surgical History  Procedure Laterality Date  . No past surgeries     History reviewed. No pertinent family history. History  Substance Use Topics  . Smoking status: Current Every Day Smoker -- 0.50 packs/day for .5 years    Types: Cigarettes  . Smokeless tobacco: Never Used  . Alcohol Use: Yes     Comment: one marjuarita every 2 months   OB History   Grav Para Term Preterm Abortions TAB SAB Ect Mult Living                 Review of Systems  Cardiovascular: Positive for syncope.  Neurological: Positive for syncope.  All other systems reviewed and are negative.    Allergies  Penicillins and Sulfa antibiotics  Home Medications   Current Outpatient Rx  Name  Route  Sig  Dispense  Refill  . diphenhydrAMINE (BENADRYL) 25 mg capsule   Oral   Take 50 mg by mouth at bedtime as needed for sleep.          Marland Kitchen gabapentin (NEURONTIN) 300 MG capsule   Oral   Take 300 mg by mouth 3 (three) times daily.          . ziprasidone (GEODON) 60 MG capsule   Oral   Take 60 mg by mouth 2 (two) times daily with a meal.          BP 88/53  Pulse 76  Temp(Src) 97.3 F (36.3 C) (Oral)  Resp 16  Ht 5' 2.5" (1.588 m)  Wt 119 lb 8 oz (54.205 kg)  BMI 21.50 kg/m2  SpO2 96%  LMP 05/14/2013 Physical Exam  Constitutional: She is oriented to person, place, and time. She appears well-developed and well-nourished. No distress.  HENT:  Head: Normocephalic and atraumatic.  Right Ear: Hearing normal.  Left Ear: Hearing normal.  Nose: Nose normal.  Mouth/Throat: Oropharynx is clear and moist and mucous membranes are normal.  Eyes: Conjunctivae and EOM are normal. Pupils are equal, round, and reactive to light.  Neck: Normal range of motion. Neck supple.  Cardiovascular: Regular rhythm, S1 normal and S2 normal.  Exam reveals no gallop and no friction rub.   No murmur heard. Pulmonary/Chest: Effort normal and breath sounds normal. No respiratory distress. She exhibits no tenderness.  Abdominal: Soft. Normal appearance and bowel sounds are normal. There is no hepatosplenomegaly. There is no tenderness. There is no rebound, no guarding, no tenderness at McBurney's point and negative Murphy's sign. No hernia.  Musculoskeletal: Normal  range of motion.  Neurological: She is alert and oriented to person, place, and time. She has normal strength. No cranial nerve deficit or sensory deficit. Coordination normal. GCS eye subscore is 4. GCS verbal subscore is 5. GCS motor subscore is 6.  Skin: Skin is warm, dry and intact. No rash noted. No cyanosis.  Psychiatric: She has a normal mood and affect. Her speech is normal and behavior is normal. Thought content normal.    ED Course  Procedures (including critical care time) Labs Review Labs Reviewed  URINALYSIS, ROUTINE W REFLEX MICROSCOPIC - Abnormal; Notable for the following:    APPearance CLOUDY (*)    Leukocytes, UA SMALL (*)    All other components within normal  limits  URINE RAPID DRUG SCREEN (HOSP PERFORMED) - Abnormal; Notable for the following:    Benzodiazepines POSITIVE (*)    All other components within normal limits  URINE MICROSCOPIC-ADD ON - Abnormal; Notable for the following:    Bacteria, UA MANY (*)    All other components within normal limits  CBC  COMPREHENSIVE METABOLIC PANEL   Imaging Review No results found.  EKG Interpretation   None       MDM   1. Major depressive disorder, recurrent episode, moderate   2. Syncope 3. Orthostasis  Patient presents to the ER after syncopal episode. Patient currently at behavioral [facility] for treatment of depression and substance abuse. Patient stood up, became symptomatic with dizziness and then briefly passed out. No concern for seizure. She was hypotensive at the time of the episode. Patient exhibits orthostatic hypotension here in the ER. She admits to not eating or drinking much recently. Patient has a tear in the ER. Remainder of the workup was unremarkable. CT head was considered, but the patient declined. As she has a normal neurologic exam, it is appropriate to defer. Patient will be sent back to behavioral health Hospital to continue her therapy.    Gilda Crease, MD 07/31/13 [phone]

## 2013-07-31 NOTE — ED Notes (Signed)
Pt from [facility]. Pt states she spat up this am and became lightheaded leading to LOC. Pt was caught by pt behind her. Pt alert, oriented at present. Was sent by [facility] for evaluation and IV fluids. Pt states something similar happened 4 weeks ago at home.

## 2013-07-31 NOTE — Progress Notes (Signed)
Patient did attend the evening karaoke group. Pt was engaged, supportive, and participated by singing and dancing.

## 2013-07-31 NOTE — ED Notes (Signed)
Per Maureen Ralphs,  At [facility], states patient needs labs drawn and fluid-states low BP this am-fell at nurses station, no LOC-states multiple losses-recently separated, loosing house, got caught shop lifting food-tested positive for benzo's' although has not been covered at [facility] be returning to [facility] once Medically cleared

## 2013-08-01 NOTE — Progress Notes (Signed)
D   Pt is depressed and anxious   She reports her biggest problem is not sleeping and that she was here to get her sleep cycle straightened out   She requested not to be woke up for vitals at 100 am as per protocal after a fall   She reports feeling much better and cannot explain why she fainted except maybe she was dehydrated   Pt attended groups and is pleasant on approach   She interacts well with others A    Verbal support given   Medications and theraputic effect discussed with patient   Medications administered and effectiveness monitored  Reassured pt that we would not wake her for vitals but if she was already awake we would get them  Q 15 min checks R  Pt safe and verbalized understanding of medications and agreed with plan regarding vitals being taken

## 2013-08-01 NOTE — [facility] Group Notes (Signed)
[facility] LCSW Aftercare Discharge Planning Group Note   08/01/2013 8:45 AM  Participation Quality:  Alert, Appropriate and Oriented  Mood/Affect:  Flat and Depressed  Depression Rating:  7-8  Anxiety Rating:  7-8  Thoughts of Suicide:  Pt denies SI/HI  Will you contract for safety?   Yes  Current AVH:  Pt denies  Plan for Discharge/Comments:  Pt attended discharge planning group and actively participated in group.  CSW provided pt with today's workbook.  Pt wants to d/c today, stating that she is just laying around here in the hospital which she could do at home.  Pt states that she thinks she would benefit from individual therapy more than the groups here.  Pt lives in [place] with her daughter and can return home there.  Pt has decided she does want to try IOP though.  Pt will follow up at [facility] and [facility] for medication management and therapy.  CSW has spent time with pt individually on finding appropriate follow up.  No further needs voiced by pt at this time.    Transportation Means: Pt reports access to transportation - pt has own car here  Supports: No supports mentioned at this time  Reyes Ivan, LCSW 08/01/2013 9:46 AM

## 2013-08-01 NOTE — Tx Team (Signed)
Interdisciplinary Treatment Plan Update (Adult)  Date: 08/01/2013  Time Reviewed:  9:45 AM  Progress in Treatment: Attending groups: Yes Participating in groups:  Yes Taking medication as prescribed:  Yes Tolerating medication:  Yes Family/Significant othe contact made: No, pt refused Patient understands diagnosis:  Yes Discussing patient identified problems/goals with staff:  Yes Medical problems stabilized or resolved:  Yes Denies suicidal/homicidal ideation: Yes Issues/concerns per patient self-inventory:  Yes Other:  New problem(s) identified: Pt to program on [address] hall now to address depression.    Discharge Plan or Barriers: Pt has follow up scheduled at [facility] for IOP and [facility] for medication management and therapy.    Reason for Continuation of Hospitalization: Anxiety Depression Medication Stabilization  Comments: N/A  Estimated length of stay: 2-3 days  For review of initial/current patient goals, please see plan of care.  Attendees: Patient:     Family:     Physician:  Dr. Dub Mikes 08/01/2013 10:24 AM   Nursing:   Malva Limes, RN  08/01/2013 10:24 AM   Clinical Social Worker:  Reyes Ivan, LCSW 08/01/2013 10:24 AM   Other: Onnie Boer, RN case manager 08/01/2013 10:24 AM   Other:  Trula Slade, LCSWA 08/01/2013 10:24 AM   Other:  Serena Colonel, NP 08/01/2013 10:24 AM   Other:     Other:    Other:    Other:    Other:    Other:    Other:     Scribe for Treatment Team:   Carmina Miller, 08/01/2013 , 10:24 AM

## 2013-08-01 NOTE — Progress Notes (Signed)
[facility] MD Progress Note  08/01/2013 12:14 PM Heather Reilly  MRN:  [id number] Subjective:  Heather Reilly endorses that she is still dealing with anxiety, worry as well as underlying depressed mood. She is asking if I would diagnosed her as Bipolar given that her current prescriber seems to think so. She states that some of her mood changes have to do with reacting to what she is going trough. She is facing financial difficulties, possibly having to let go of the house, etc. She sates that she has had SI but that she would never act on them because of her kids.  Diagnosis:   DSM5: Schizophrenia Disorders:  Denies Obsessive-Compulsive Disorders:  Denies Trauma-Stressor Disorders:  Denies Substance/Addictive Disorders:  Benzodiazepine related disorders Depressive Disorders:  Major Depressive Disorder - Moderate (296.22)  Axis I: Anxiety Disorder NOS  ADL's:  Intact  Sleep: Fair  Appetite:  Fair  Suicidal Ideation:  Plan:  denies Intent:  denies Means:  denies Homicidal Ideation:  Plan:  denies Intent:  denies Means:  denies AEB (as evidenced by):  Psychiatric Specialty Exam: Review of Systems  Constitutional: Negative.   HENT: Negative.   Eyes: Negative.   Respiratory: Negative.   Cardiovascular: Negative.   Gastrointestinal: Negative.   Genitourinary: Negative.   Musculoskeletal: Negative.   Skin: Negative.   Neurological: Negative.   Endo/Heme/Allergies: Negative.   Psychiatric/Behavioral: Positive for depression and substance abuse. The patient is nervous/anxious.     Blood pressure 113/71, pulse 84, temperature 97.8 F (36.6 C), temperature source Oral, resp. rate 16, height 5' 2.5" (1.588 m), weight 54.205 kg (119 lb 8 oz), last menstrual period 05/14/2013, SpO2 96.00%.Body mass index is 21.5 kg/(m^2).  General Appearance: Fairly Groomed  Patent attorney::  Fair  Speech:  Clear and Coherent  Volume:  Decreased  Mood:  Anxious and sad, worried  Affect:  anxious, worried   Thought Process:  Coherent and Goal Directed  Orientation:  Full (Time, Place, and Person)  Thought Content:  symptoms, events, worries, concerns  Suicidal Thoughts:  No  Homicidal Thoughts:  No  Memory:  Immediate;   Fair Recent;   Fair Remote;   Fair  Judgement:  Fair  Insight:  Present  Psychomotor Activity:  Restlessness  Concentration:  Fair  Recall:  Fair  Akathisia:  No  Handed:    AIMS (if indicated):     Assets:  Desire for Improvement Housing Transportation  Sleep:  Number of Hours: 5.25   Current Medications: Current Facility-Administered Medications  Medication Dose Route Frequency Provider Last Rate Last Dose  . acetaminophen (TYLENOL) tablet 650 mg  650 mg Oral Q6H PRN Rachael Fee, MD      . alum & mag hydroxide-simeth (MAALOX/MYLANTA) 200-200-20 MG/5ML suspension 30 mL  30 mL Oral Q4H PRN Rachael Fee, MD      . feeding supplement (ENSURE COMPLETE) (ENSURE COMPLETE) liquid 237 mL  237 mL Oral BID BM Jeoffrey Massed, RD   237 mL at 07/31/13 0842  . hydrOXYzine (ATARAX/VISTARIL) tablet 50 mg  50 mg Oral Q6H PRN Kerry Hough, PA-C   50 mg at 07/29/13 2313  . magnesium hydroxide (MILK OF MAGNESIA) suspension 30 mL  30 mL Oral Daily PRN Rachael Fee, MD      . QUEtiapine (SEROQUEL) tablet 100 mg  100 mg Oral QHS Rachael Fee, MD   100 mg at 07/31/13 2229  . traZODone (DESYREL) tablet 50 mg  50 mg Oral QHS PRN Rachael Fee,  MD   50 mg at 07/30/13 0104  . Vilazodone HCl (VIIBRYD) TABS 10 mg  10 mg Oral Daily Rachael Fee, MD   10 mg at 08/01/13 0807    Lab Results:  Results for orders placed during the hospital encounter of 07/29/13 (from the past 48 hour(s))  CBC WITH DIFFERENTIAL     Status: Abnormal   Collection Time    07/31/13 10:00 AM      Result Value Range   WBC 7.4  4.0 - 10.5 K/uL   RBC 3.98  3.87 - 5.11 MIL/uL   Hemoglobin 12.1  12.0 - 15.0 g/dL   HCT 16.1 (*) 09.6 - 04.5 %   MCV 88.2  78.0 - 100.0 fL   MCH 30.4  26.0 - 34.0 pg   MCHC 34.5   30.0 - 36.0 g/dL   RDW 40.9  81.1 - 91.4 %   Platelets 211  150 - 400 K/uL   Neutrophils Relative % 66  43 - 77 %   Neutro Abs 4.9  1.7 - 7.7 K/uL   Lymphocytes Relative 25  12 - 46 %   Lymphs Abs 1.8  0.7 - 4.0 K/uL   Monocytes Relative 7  3 - 12 %   Monocytes Absolute 0.5  0.1 - 1.0 K/uL   Eosinophils Relative 2  0 - 5 %   Eosinophils Absolute 0.1  0.0 - 0.7 K/uL   Basophils Relative 1  0 - 1 %   Basophils Absolute 0.0  0.0 - 0.1 K/uL  BASIC METABOLIC PANEL     Status: Abnormal   Collection Time    07/31/13 10:00 AM      Result Value Range   Sodium 139  135 - 145 mEq/L   Potassium 3.8  3.5 - 5.1 mEq/L   Chloride 104  96 - 112 mEq/L   CO2 26  19 - 32 mEq/L   Glucose, Bld 115 (*) 70 - 99 mg/dL   BUN 14  6 - 23 mg/dL   Creatinine, Ser 7.82  0.50 - 1.10 mg/dL   Calcium 9.2  8.4 - 95.6 mg/dL   GFR calc non Af Amer >90  >90 mL/min   GFR calc Af Amer >90  >90 mL/min   Comment: (NOTE)     The eGFR has been calculated using the CKD EPI equation.     This calculation has not been validated in all clinical situations.     eGFR's persistently <90 mL/min signify possible Chronic Kidney     Disease.    Physical Findings: AIMS: Facial and Oral Movements Muscles of Facial Expression: None, normal Lips and Perioral Area: None, normal Jaw: None, normal Tongue: None, normal,Extremity Movements Upper (arms, wrists, hands, fingers): None, normal Lower (legs, knees, ankles, toes): None, normal, Trunk Movements Neck, shoulders, hips: None, normal, Overall Severity Severity of abnormal movements (highest score from questions above): None, normal Incapacitation due to abnormal movements: None, normal Patient's awareness of abnormal movements (rate only patient's report): No Awareness, Dental Status Current problems with teeth and/or dentures?: No Does patient usually wear dentures?: No  CIWA:  CIWA-Ar Total: 4 COWS:  COWS Total Score: 2  Treatment Plan Summary: Daily contact with patient  to assess and evaluate symptoms and progress in treatment Medication management  Plan: Supportive approach/coping skills/relapse prevention           Continue Viibryd/Seroquel            CBT;mindfulness  Discuss symtpoms, criteria for Dx of Bipolar vs. Unipolar Depression Medical Decision Making Problem Points:  Review of last therapy session (1) Data Points:  Review of medication regiment & side effects (2)  I certify that inpatient services furnished can reasonably be expected to improve the patient's condition.   Tyshaun Vinzant A 08/01/2013, 12:14 PM

## 2013-08-01 NOTE — Progress Notes (Signed)
Recreation Therapy Notes  Date: 12.18.2014 Time: 3:00pm Location: [address] Dayroom   Group Topic: Communication, Team Building, Problem Solving  Goal Area(s) Addresses:  Patient will effectively work with peer towards shared goal.  Patient will identify skill used to make activity successful.  Patient will identify how skills used during activity can be used to reach post d/c goals.   Behavioral Response: Engaged, Attentive, Appropriate  Intervention: Problem Solving Activitiy  Activity: Life Boat. Patients were given a scenario about being on a sinking yacht. Patients were informed the yacht included 15 guest, 8 of which could be placed on the life boat, along with all group members. Individuals on guest list were of varying socioeconomic classes such as a Education officer, museum, Materials engineer, Midwife, Tree surgeon.   Education: Pharmacist, community, Discharge Planning    Education Outcome: Acknowledges understanding & In group clarification offered.   Clinical Observations/Feedback: Patient actively engaged in group activity, voicing his opinion and debating with peers appropriately. Patient contributed to group discussion identifying qualities that guided her decision making, as well as relating those skills to group skills used. Patient struggled with identifying how skills can be used to help her handle her depression, as she thought this activity was resserved for detox patients. LRT counseled patient on ability to use these skills to help her stop isolating and combat some of her depressive symptoms. Patient acknowledged guidance.   Marykay Lex Xena Propst, LRT/CTRS  Phylliss Strege L 08/01/2013 8:47 AM

## 2013-08-01 NOTE — [facility] Group Notes (Signed)
[facility] LCSW Group Therapy  08/01/2013  1:15 PM   Type of Therapy:  Group Therapy  Participation Level:  Active  Participation Quality:  Appropriate, Sharing and Supportive  Affect:  Depressed and Flat  Cognitive:  Alert and Oriented  Insight:  Developing/Improving and Engaged  Engagement in Therapy:  Developing/Improving and Engaged  Modes of Intervention:  Clarification, Confrontation, Discussion, Education, Exploration, Limit-setting, Orientation, Problem-solving, Rapport Building, Dance movement psychotherapist, Socialization and Support  Summary of Progress/Problems: The topic for today was feelings about relapse.  Pt discussed what relapse prevention is to them and identified triggers that they are on the path to relapse.  Pt processed their feeling towards relapse and was able to relate to peers.  Pt discussed coping skills that can be used for relapse prevention.  Pt shared that she feels she selfish for having pity parties and now caring for her daughter's needs.  Pt states that she wants to focus on her daughter and getting better.  Pt is worried about what will be different when she returns home.  Pt actively participated and was engaged in group discussion.    Reyes Ivan, LCSW 08/01/2013 2:32 PM

## 2013-08-01 NOTE — Progress Notes (Signed)
Pt programed on 500 floor.

## 2013-08-01 NOTE — Progress Notes (Signed)
Patient ID: Heather Reilly, female   DOB: 1966-02-05, 47 y.o.   MRN: [id number] D. Patient presents very depressed, irritable and anxious this am. She states '' What am I taking now, ? Actually I think I'll be leaving today. But I really need to speak to the doctor about having someone to talk to you know. I'm afraid to go home by myself , not because I'm going to hurt myself I'm just going through a lot and I don't want to be alone '' Patient completed self inventory and rates depression at 8/10 on depression scale 10 being worst depression, 1 being least. She also rates hopelessness at same on scale with 10 being worst hopelessness. And she continues to endorse feeling anxious and agitated. A. Medications given, including prn medications for anxiety. Support and encouragement provided. Discussed above and patients reports with MD. R. Patient visible in the milieu, in no acute distress at this time. Will continue to monitor q 15 minutes for safety.

## 2013-08-02 NOTE — [facility] Group Notes (Signed)
[facility] Group Notes:  (Clinical Social Work)  08/02/2013   3:00-4:00PM ON [address] HALL  Summary of Progress/Problems:   The main focus of today's process group was for the patient to identify ways in which they have sabotaged their own mental health wellness/recovery.  Motivational interviewing was used to explore the reasons they engage in this behavior, and reasons they may have for wanting to change.   Using a scale of 1-10 (no confidence to complete confidence), the patients were asked to rate their confidence in changing their self-sabotaging behavior.  The patient expressed that she uses every type of self-sabotage mentioned by every other patient, including lying to herself and others, not asking for help, isolation, rumination.  She said her confidence in her ability to change is 5-6.  She has tried a lot of the negative ways of getting her needs met, has stumbled a lot, but also feels she has tried a lot of the positive ways of getting help with the same result of not getting the help.  The group reminded her that just because one asks for help does not mean one will receive it, but it is healthy to ask for help rather than self-sabotaging by not admitting problems.  Type of Therapy:  Process Group  Participation Level:  Active  Participation Quality:  Attentive and Sharing  Affect:  Blunted and Depressed and Anxioius  Cognitive:  Alert and Oriented  Insight:  Developing/Improving  Engagement in Therapy:  Engaged  Modes of Intervention:  Education, Motivational Interviewing   Ambrose Mantle, LCSW 08/02/2013, 4:00pm

## 2013-08-02 NOTE — Progress Notes (Signed)
[facility] MD Progress Note  08/02/2013 2:43 PM Heather Reilly  MRN:  [id number]  Subjective:  Patient has no complaints today. Patient has been minimizing her symptoms of depression and mood swings. Patient has been compliant with her medication and has no reported adverse effects. Patient stated that " I have a hard time getting on my feet and can't find work". 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 Patient was seeing Dr. Jannifer Franklin for outpatient psychiatric medication management and also has a plan of following up with intensive outpatient program. Patient and the staff nurses reported that she was upset about not able to be discharged today and patient treatment team is stated that she is not a weekend discharge. Patient is still dealing with anxiety, worry as well as underlying depressed mood. Patient diagnosis is not clear from depression to bipolar disorder. She states that some of her mood changes have to do with reacting to what she is going trough. She is facing financial difficulties, possibly having to let go of the house, etc. She sates that she has had SI but that she would never act on them because of her kids.   Diagnosis:   DSM5: Schizophrenia Disorders:  Denies Obsessive-Compulsive Disorders:  Denies Trauma-Stressor Disorders:  Denies Substance/Addictive Disorders:  Benzodiazepine related disorders Depressive Disorders:  Major Depressive Disorder - Moderate (296.22)  Axis I: Anxiety Disorder NOS  ADL's:  Intact  Sleep: Fair  Appetite:  Fair  Suicidal Ideation:  Plan:  denies Intent:  denies Means:  denies Homicidal Ideation:  Plan:  denies Intent:  denies Means:  denies AEB (as evidenced by):  Psychiatric Specialty Exam: Review of Systems  Constitutional: Negative.   HENT: Negative.   Eyes: Negative.   Respiratory: Negative.   Cardiovascular: Negative.   Gastrointestinal: Negative.   Genitourinary: Negative.    Musculoskeletal: Negative.   Skin: Negative.   Neurological: Negative.   Endo/Heme/Allergies: Negative.   Psychiatric/Behavioral: Positive for depression and substance abuse. The patient is nervous/anxious.     Blood pressure 100/65, pulse 93, temperature 97.5 F (36.4 C), temperature source Oral, resp. rate 14, height 5' 2.5" (1.588 m), weight 54.205 kg (119 lb 8 oz), last menstrual period 05/14/2013, SpO2 96.00%.Body mass index is 21.5 kg/(m^2).  General Appearance: Fairly Groomed  Patent attorney::  Fair  Speech:  Clear and Coherent  Volume:  Decreased  Mood:  Anxious and sad, worried  Affect:  anxious, worried  Thought Process:  Coherent and Goal Directed  Orientation:  Full (Time, Place, and Person)  Thought Content:  symptoms, events, worries, concerns  Suicidal Thoughts:  No  Homicidal Thoughts:  No  Memory:  Immediate;   Fair Recent;   Fair Remote;   Fair  Judgement:  Fair  Insight:  Present  Psychomotor Activity:  Restlessness  Concentration:  Fair  Recall:  Fair  Akathisia:  No  Handed:    AIMS (if indicated):     Assets:  Desire for Improvement Housing Transportation  Sleep:  Number of Hours: 5.25   Current Medications: Current Facility-Administered Medications  Medication Dose Route Frequency Provider Last Rate Last Dose  . acetaminophen (TYLENOL) tablet 650 mg  650 mg Oral Q6H PRN Rachael Fee, MD      . alum & mag hydroxide-simeth (MAALOX/MYLANTA) 200-200-20 MG/5ML suspension 30 mL  30 mL Oral Q4H PRN Rachael Fee, MD      . feeding supplement (ENSURE COMPLETE) (ENSURE COMPLETE) liquid 237  mL  237 mL Oral BID BM Jeoffrey Massed, RD   237 mL at 08/02/13 1435  . hydrOXYzine (ATARAX/VISTARIL) tablet 50 mg  50 mg Oral Q6H PRN Kerry Hough, PA-C   50 mg at 08/02/13 1142  . magnesium hydroxide (MILK OF MAGNESIA) suspension 30 mL  30 mL Oral Daily PRN Rachael Fee, MD      . QUEtiapine (SEROQUEL) tablet 100 mg  100 mg Oral QHS Rachael Fee, MD   100 mg at  08/01/13 2227  . traZODone (DESYREL) tablet 50 mg  50 mg Oral QHS PRN Rachael Fee, MD   50 mg at 07/30/13 0104  . Vilazodone HCl (VIIBRYD) TABS 10 mg  10 mg Oral Daily Rachael Fee, MD   10 mg at 08/02/13 1914    Lab Results:  No results found for this or any previous visit (from the past 48 hour(s)).  Physical Findings: AIMS: Facial and Oral Movements Muscles of Facial Expression: None, normal Lips and Perioral Area: None, normal Jaw: None, normal Tongue: None, normal,Extremity Movements Upper (arms, wrists, hands, fingers): None, normal Lower (legs, knees, ankles, toes): None, normal, Trunk Movements Neck, shoulders, hips: None, normal, Overall Severity Severity of abnormal movements (highest score from questions above): None, normal Incapacitation due to abnormal movements: None, normal Patient's awareness of abnormal movements (rate only patient's report): No Awareness, Dental Status Current problems with teeth and/or dentures?: No Does patient usually wear dentures?: No  CIWA:  CIWA-Ar Total: 4 COWS:  COWS Total Score: 2  Treatment Plan Summary: Daily contact with patient to assess and evaluate symptoms and progress in treatment Medication management  Plan: Supportive approach/coping skills/relapse prevention           Continue Viibryd/Seroquel            CBT;mindfulness            Discuss symtpoms, criteria for Dx of Bipolar vs. Unipolar Depression Medical Decision Making Problem Points:  Review of last therapy session (1) Data Points:  Review of medication regiment & side effects (2)  I certify that inpatient services furnished can reasonably be expected to improve the patient's condition.   Kenniya Westrich,JANARDHAHA R. 08/02/2013, 2:43 PM

## 2013-08-02 NOTE — Progress Notes (Signed)
Patient ID: Heather Reilly, female   DOB: October 07, 1965, 47 y.o.   MRN: [id number] D)  Has been pleasant and cooperative but c/o anxiety, depression.  Attended group, interacting appropriately with staff and peers, came to med window afterward, voiced concern that she wouldn't sleep tonight.  Was medicaed and resting quietly. A)  Will continue to monitor for safety, continue POC R)  Safety maintained.

## 2013-08-02 NOTE — [facility] Group Notes (Signed)
[facility] Group Notes:  (Clinical Social Work)  08/02/2013     10-11AM  Summary of Progress/Problems:   The main focus of today's process group was for the patient to identify ways in which they have in the past sabotaged their own recovery. Motivational Interviewing was utilized to ask the group members what they get out of their substance use, and what reasons they may have for wanting to change.  The Stages of Change were explained using a handout, and patients identified where they currently are with regard to stages of change.  The patient expressed that she is consumed with anxiety about her car, fearful about not having enough money for gas if she keeps driving around applying for jobs and not actually getting one.  She asked other people in the group if they don't worry as they're using drugs that they won't have enough money for needs, and did not seem to believe their answers about seeking immediate gratification.  She stated she self-sabotages with isolation and plans to change that by staying more with her mother and brother at discharge.  She is also interested in [employer], feels she is in Preparation stage.  Type of Therapy:  Group Therapy - Process   Participation Level:  Active  Participation Quality:  Attentive and Sharing  Affect:  Anxious and Blunted  Cognitive:  Oriented  Insight:  Developing/Improving  Engagement in Therapy:  Developing/Improving  Modes of Intervention:  Education, Support and Processing, Motivational Interviewing  Ambrose Mantle, LCSW 08/02/2013, 12:59 PM

## 2013-08-02 NOTE — Progress Notes (Signed)
Patient did attend the evening speaker AA meeting.  

## 2013-08-02 NOTE — Progress Notes (Addendum)
Pt appears depressed and lethargic this am. She stated she has been looking for a job since last January. Pt admitted she was hired at [employer] 2 weeks ago but let go because she was not staying in the work area she was assigned to. Pt is fearful she will loose her house as she only has child support coming in.. 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 The pt. feels she has no where to turn. Nurse discussed with her other options and possibilites . Pt did seem receptive but is very down on herself. She does contract for safety and denies SI or HI .  Pt did come to group. Pt on her self inventory stated her hopelessness was a 2/10 and depression a 2/10 -She would like to start outpt. therapy and start taking medications to feel better. Pt is very upset she is not for discharge today . Spoke with Dr. Shela Commons about this.Pt does appear hopeless but also agitated that she is not being discharged today. She appears overly determined to want to leave today. Per Dr. Shela Commons pt will not be discharged this weekend as it is not a part of the team plan. Pt is programming on the 500 hall. She stated she feels no family members care about her the patient stated,"I had to call my ex husbands's mother for clothes." Nurse asked pt if family even knew she was here.Pt did not reply. She stated she probably needs to sell her house but does not have the energy to get it ready. One of her sisters offered to let her move in but told her she would have to put all her furniture in storage. Pt was given some resources for jobs. She admitted she has been with 4 temp agencies but has not received any call backs.4pm-Pt presently is in group on the 500 hall and appears engaged in the group.

## 2013-08-03 MED ORDER — VILAZODONE HCL 10 MG PO TABS
20.0000 mg | ORAL_TABLET | Freq: Every day | ORAL | Status: DC
  Administered 2013-08-04: 20 mg via ORAL
  Filled 2013-08-03 (×2): qty 2
  Filled 2013-08-03: qty 8

## 2013-08-03 MED ORDER — QUETIAPINE FUMARATE 100 MG PO TABS
150.0000 mg | ORAL_TABLET | Freq: Every day | ORAL | Status: DC
  Administered 2013-08-03: 150 mg via ORAL
  Filled 2013-08-03 (×3): qty 1

## 2013-08-03 NOTE — Progress Notes (Signed)
Patient ID: Heather Reilly, female   DOB: 1965-12-11, 47 y.o.   MRN: [id number] D)  Attended group this evening, came to the med window afterward and said she was sorry she had come back here.  Feels upset that she wasn't discharged today, thinking of things she needs to get done and feels trapped here.  Took hs seroquel but was unable to sleep, stated was just lying awake worrying, was given trazodone and has since dozed off.   A)  Will continue to monitor for safety,  R)  Safety maintained.

## 2013-08-03 NOTE — BHH Group Notes (Signed)
[facility] Group Notes:  (Nursing/MHT/Case Management/Adjunct)  Date:  08/03/2013  Time:  2:11 PM  Type of Therapy:  Nurse Education  Participation Level:  Did Not Attend  Participation Quality:  did not attend, patient was programming on 500 hall  Affect:  Did not attend  Cognitive:  did not attend  Insight:  None  Engagement in Group:  did not attend  Modes of Intervention:  did not attend  Summary of Progress/Problems:  Larina Earthly 08/03/2013, 2:11 PM

## 2013-08-03 NOTE — Progress Notes (Signed)
[facility] MD Progress Note  08/03/2013 11:39 AM Heather Reilly  MRN:  [id number]  Subjective:  Patient has complaints of feeling depressed, anxious and worried about her financial situation. Patient stated she cannot make up her mind regarding selling her house and taking care of her child who is 49 years old. Reportedly she has a supportive siblings who are for her help but she's not able to make up her mind. Patient has been compliant with her medication and has no reported adverse effects. Patient has been noncompliant with her medication because she's not able to keep track of medication, dosages and time to take. Patient was previously treated at [facility] behavioral care in [place]. She sates that she has had SI but that she would never act on them because of her kids.   Diagnosis:   DSM5: Schizophrenia Disorders:  Denies Obsessive-Compulsive Disorders:  Denies Trauma-Stressor Disorders:  Denies Substance/Addictive Disorders:  Benzodiazepine related disorders Depressive Disorders:  Major Depressive Disorder - Moderate (296.22)  Axis I: Anxiety Disorder NOS  ADL's:  Intact  Sleep: Fair  Appetite:  Fair  Suicidal Ideation:  Plan:  denies Intent:  denies Means:  denies Homicidal Ideation:  Plan:  denies Intent:  denies Means:  denies AEB (as evidenced by):  Psychiatric Specialty Exam: Review of Systems  Constitutional: Negative.   HENT: Negative.   Eyes: Negative.   Respiratory: Negative.   Cardiovascular: Negative.   Gastrointestinal: Negative.   Genitourinary: Negative.   Musculoskeletal: Negative.   Skin: Negative.   Neurological: Negative.   Endo/Heme/Allergies: Negative.   Psychiatric/Behavioral: Positive for depression and substance abuse. The patient is nervous/anxious.     Blood pressure 90/55, pulse 110, temperature 97.5 F (36.4 C), temperature source Oral, resp. rate 14, height 5' 2.5" (1.588 m), weight 54.205 kg (119 lb 8 oz), last menstrual period  05/14/2013, SpO2 96.00%.Body mass index is 21.5 kg/(m^2).  General Appearance: Fairly Groomed  Patent attorney::  Fair  Speech:  Clear and Coherent  Volume:  Decreased  Mood:  Anxious and sad, worried  Affect:  anxious, worried  Thought Process:  Coherent and Goal Directed  Orientation:  Full (Time, Place, and Person)  Thought Content:  symptoms, events, worries, concerns  Suicidal Thoughts:  No  Homicidal Thoughts:  No  Memory:  Immediate;   Fair Recent;   Fair Remote;   Fair  Judgement:  Fair  Insight:  Present  Psychomotor Activity:  Restlessness  Concentration:  Fair  Recall:  Fair  Akathisia:  No  Handed:    AIMS (if indicated):     Assets:  Desire for Improvement Housing Transportation  Sleep:  Number of Hours: 5.75   Current Medications: Current Facility-Administered Medications  Medication Dose Route Frequency Provider Last Rate Last Dose  . acetaminophen (TYLENOL) tablet 650 mg  650 mg Oral Q6H PRN Rachael Fee, MD      . alum & mag hydroxide-simeth (MAALOX/MYLANTA) 200-200-20 MG/5ML suspension 30 mL  30 mL Oral Q4H PRN Rachael Fee, MD      . feeding supplement (ENSURE COMPLETE) (ENSURE COMPLETE) liquid 237 mL  237 mL Oral BID BM Jeoffrey Massed, RD   237 mL at 08/02/13 1435  . hydrOXYzine (ATARAX/VISTARIL) tablet 50 mg  50 mg Oral Q6H PRN Kerry Hough, PA-C   50 mg at 08/02/13 1142  . magnesium hydroxide (MILK OF MAGNESIA) suspension 30 mL  30 mL Oral Daily PRN Rachael Fee, MD      . QUEtiapine (SEROQUEL) tablet  100 mg  100 mg Oral QHS Rachael Fee, MD   100 mg at 08/02/13 2136  . traZODone (DESYREL) tablet 50 mg  50 mg Oral QHS PRN Rachael Fee, MD   50 mg at 08/02/13 2336  . Vilazodone HCl (VIIBRYD) TABS 10 mg  10 mg Oral Daily Rachael Fee, MD   10 mg at 08/03/13 1610    Lab Results:  No results found for this or any previous visit (from the past 48 hour(s)).  Physical Findings: AIMS: Facial and Oral Movements Muscles of Facial Expression: None,  normal Lips and Perioral Area: None, normal Jaw: None, normal Tongue: None, normal,Extremity Movements Upper (arms, wrists, hands, fingers): None, normal Lower (legs, knees, ankles, toes): None, normal, Trunk Movements Neck, shoulders, hips: None, normal, Overall Severity Severity of abnormal movements (highest score from questions above): None, normal Incapacitation due to abnormal movements: None, normal Patient's awareness of abnormal movements (rate only patient's report): No Awareness, Dental Status Current problems with teeth and/or dentures?: No Does patient usually wear dentures?: No  CIWA:  CIWA-Ar Total: 4 COWS:  COWS Total Score: 2  Treatment Plan Summary: Daily contact with patient to assess and evaluate symptoms and progress in treatment Medication management  Plan:    Increase Seroquel 150 mg PO Qhs for mood and insomnia   Increase Viibryd 20 mg QD for depression  Supportive approach/coping skills/relapse prevention             CBT;mindfulness             Discuss symtpoms, criteria for Dx of Bipolar vs. Unipolar Depression Medical Decision Making Problem Points:  Review of last therapy session (1) Data Points:  Review of medication regiment & side effects (2)  I certify that inpatient services furnished can reasonably be expected to improve the patient's condition.   Kalev Temme,JANARDHAHA R. 08/03/2013, 11:39 AM

## 2013-08-03 NOTE — BHH Group Notes (Signed)
[facility] Group Notes: (Clinical Social Work)   08/03/2013      Type of Therapy:  Group Therapy   Participation Level:  Did Not Attend (is programming on 300 hall)   Ambrose Mantle, LCSW 08/03/2013, 12:28 PM

## 2013-08-03 NOTE — BHH Group Notes (Signed)
B[facility] Group Notes:  (Clinical Social Work)  08/03/2013   3-4PM  (300 hall)  Summary of Progress/Problems:  The main focus of today's process group was to   identify the patient's current support system and decide on other supports that can be put in place.  The picture on workbook was used to discuss why additional supports are needed.  An emphasis was placed on using counselor, doctor, therapy groups, 12-step groups, and problem-specific support groups to expand supports.   There was also an extensive discussion about what constitutes a healthy support versus an unhealthy support.  The patient expressed full comprehension of the concepts presented, and agreed that there is a need to add more supports.  The patient turned down opportunities to talk in group today, stating "I'm just sitting back and taking it all in.":  Type of Therapy:  Process Group  Participation Level:  Active  Participation Quality:  Attentive and Sharing  Affect:  Blunted and Depressed and Anxious  Cognitive:  Appropriate and Oriented  Insight:  Developing/Improving  Engagement in Therapy:  Engaged  Modes of Intervention:  Education,  Support and [employer], LCSW 08/03/2013, 4:00pm

## 2013-08-03 NOTE — Progress Notes (Signed)
Patient ID: Heather Reilly, female   DOB: 1965-10-28, 47 y.o.   MRN: [id number]  D: Patient presents with depressed mood and anxious affect. Pt. Denies SI/HI and A/V Hallucinations. Patient does not report any pain or discomfort at this time. Patient rates her depression and hopelessness at a 1/10 today. Patient does report that she is bored here and is ready to go home. Patient also keeps inquiring if it is in her chart that she has been drinking.  A: Support and encouragement provided to the patient. Scheduled medications given to patient.  R: Patient is receptive and cooperative with Clinical research associate but forwards little. Patient is seen in the milieu and is going to groups on the 500 hall. Q15 minute checks are maintained for safety.

## 2013-08-03 NOTE — BHH Group Notes (Signed)
[facility] Group Notes:  (Nursing/MHT/Case Management/Adjunct)  Date:  08/03/2013  Time:  5:03 PM  Type of Therapy:  Nurse Education  Participation Level:  Did Not Attend  Participation Quality:  did not attend  Affect:  did not attend  Cognitive:  did not attend  Insight:  None  Engagement in Group:  did not attend  Modes of Intervention:  did not attend  Summary of Progress/Problems: patient programs on 500 hall.  Marzetta Board E 08/03/2013, 5:03 PM

## 2013-08-04 DIAGNOSIS — F331 Major depressive disorder, recurrent, moderate: Principal | ICD-10-CM

## 2013-08-04 DIAGNOSIS — F1020 Alcohol dependence, uncomplicated: Secondary | ICD-10-CM

## 2013-08-04 DIAGNOSIS — F1320 Sedative, hypnotic or anxiolytic dependence, uncomplicated: Secondary | ICD-10-CM

## 2013-08-04 MED ORDER — TRAZODONE HCL 50 MG PO TABS: 50.0000 mg | ORAL_TABLET | Freq: Every evening | ORAL | Status: DC | PRN

## 2013-08-04 MED ORDER — QUETIAPINE FUMARATE 50 MG PO TABS: 150.0000 mg | ORAL_TABLET | Freq: Every day | ORAL | Status: DC

## 2013-08-04 MED ORDER — QUETIAPINE FUMARATE 50 MG PO TABS
150.0000 mg | ORAL_TABLET | Freq: Every day | ORAL | Status: DC
  Filled 2013-08-04: qty 12

## 2013-08-04 MED ORDER — HYDROXYZINE HCL 50 MG PO TABS: ORAL_TABLET | ORAL | Status: DC

## 2013-08-04 MED ORDER — VILAZODONE HCL 10 MG PO TABS: 20.0000 mg | ORAL_TABLET | Freq: Every day | ORAL | Status: DC

## 2013-08-04 MED ORDER — HYDROXYZINE HCL 50 MG PO TABS
50.0000 mg | ORAL_TABLET | Freq: Three times a day (TID) | ORAL | Status: DC | PRN
  Filled 2013-08-04: qty 10

## 2013-08-04 NOTE — BHH Group Notes (Signed)
[facility] LCSW Aftercare Discharge Planning Group Note   08/04/2013 11:43 AM  Participation Quality:  Active and Engaged  Mood/Affect:  Depressed and Flat  Depression Rating:  Patient reports low depression 1   Anxiety Rating:  None reported  Thoughts of Suicide:  No Will you contract for safety?   Yes  Current AVH:  NA  Plan for Discharge/Comments:  Patient reports she will return home with follow up at IOP at [facility] and once completed will return back to [place].  Patient reports that her insurance may run out, thus LCSW gave resources for treatment for uninsured at [employer] [facility].  Patient reports she will also need a work note, thus LCSW will complete.  Transportation Means: patient car is at [facility], thus will drive self home.  Supports:  Family.  Nail, Catalina Gravel

## 2013-08-04 NOTE — BHH Suicide Risk Assessment (Signed)
Suicide Risk Assessment  Discharge Assessment     Demographic Factors:  Caucasian, Low socioeconomic status, Unemployed and female  Mental Status Per Nursing Assessment::   On Admission:     Current Mental Status by Physician: patient denies suicidal ideation, intent or plan  Loss Factors: Financial problems/change in socioeconomic status  Historical Factors: Impulsivity  Risk Reduction Factors:   Sense of responsibility to family, Living with another person, especially a relative and Positive social support  Continued Clinical Symptoms:  Alcohol/Substance Abuse/Dependencies  Cognitive Features That Contribute To Risk:  Closed-mindedness    Suicide Risk:  Minimal: No identifiable suicidal ideation.  Patients presenting with no risk factors but with morbid ruminations; may be classified as minimal risk based on the severity of the depressive symptoms  Discharge Diagnoses:   AXIS I:  Major depressive disorder, recurrent episode, moderate              Benzodiazepine use disorder AXIS II:  Deferred AXIS III:   Past Medical History  Diagnosis Date  . Anxiety   . Depression    AXIS IV:  economic problems, other psychosocial or environmental problems and problems related to social environment AXIS V:  61-70 mild symptoms  Plan Of Care/Follow-up recommendations:  Activity:  as tolerated Diet:  healthy Tests:  routine Other:  patient to keep her after care appointment  Is patient on multiple antipsychotic therapies at discharge:  No   Has Patient had three or more failed trials of antipsychotic monotherapy by history:  No  Recommended Plan for Multiple Antipsychotic Therapies: NA  Ladavion Savitz,MD  08/04/2013, 11:29 AM

## 2013-08-04 NOTE — Progress Notes (Signed)
Pt d/c from hospital. All items returned. D/C instructions given, prescriptions given and samples given. Pt denies si and hi.  

## 2013-08-04 NOTE — Discharge Summary (Signed)
Physician Discharge Summary Note  Patient:  Heather Reilly is an 47 y.o., female MRN:  [id number] DOB:  June 01, 1966 Patient phone:  [phone] (home)  Patient address:   [address] [place] [address],   Date of Admission:  07/29/2013 Date of Discharge: 08/04/13  Reason for Admission: Drug detox  Discharge Diagnoses: Principal Problem:   Major depressive disorder, recurrent episode, moderate Active Problems:   Depression  Review of Systems  Constitutional: Negative.   HENT: Negative.   Eyes: Negative.   Respiratory: Negative.   Cardiovascular: Negative.   Gastrointestinal: Negative.   Genitourinary: Negative.   Musculoskeletal: Negative.   Skin: Negative.   Neurological: Negative.   Endo/Heme/Allergies: Negative.   Psychiatric/Behavioral: Positive for depression (Stabilized with medication prior to discharge) and substance abuse (Benzodiazepine/alcohol abuse). Negative for suicidal ideas, hallucinations and memory loss. The patient is nervous/anxious (Stabilized with medication prior to discharge) and has insomnia (Stabilized with medication prior to discharge).     DSM5: Schizophrenia Disorders:  NA Obsessive-Compulsive Disorders:  NA Trauma-Stressor Disorders:  NA Substance/Addictive Disorders:  Alcohol dependence, Benzodiazepine dependence Depressive Disorders:  Major depressive disorder, recurrent episode, moderate  Axis Diagnosis:  AXIS I:  Alcohol dependence, Benzodiazepine dependence, Major depressive disorder, recurrent episode, moderate AXIS II:  Deferred AXIS III:   Past Medical History  Diagnosis Date  . Anxiety   . Depression    AXIS IV:  other psychosocial or environmental problems and Substance dependence, AXIS V:  62  Level of Care:  OP  [facility] Course: 47 Y/O admits to increased depression. States she lost her job, going trough a divorce, son still in [place]. When she got out last time she attempted to go back to school, (on line  classes) computer blew up on her, could not navigate all that was expected. Withdrew from school. States she is fearful, does not know how is she going trough with all this. Has had jobs short term temporary, nothing long term. States she is feeling down about herself, no self confidence, feels like she wants to die, give up. States she is doing and saying things like waking up middle of the night and coughing to wake up her daughter, driving with daughter saying she felt like flipping the car. She states she is afraid she is affecting her daughter.  While a patient in this [facility] and after admission assessment/evaluation, it was determined based on patient's symptoms that her toxicology/UDS reports indicated only the presence of Benzodiazepine in her systems. Although feeling depressed, anxious and upset, patient was not presenting any withdrawal symptoms of any substances. As a result, she received no detoxification treatment protocol. However, it was determined that she will need medication management to stabilize her current depressive mood symptoms as she has a history of major depressive disorder. She was ordered and received Seroquel 50 mg Q bedtime for mood control, Viibryd 10 mg daily for depression, Hydroxyzine 50 mg tid for anxiety/tension and Trazodone 50 mg Q bedtime for sleep. She also was enrolled in group counseling sessions and activities where she was counseled and learned coping skills that should help her cope better and manage her symptoms effectively after discharge. She also was sent to the [facility] during her stay in this [facility] for evaluation of syncope episodes. She was evaluated, treated and sent back to the unit to complete mood stabilization treatment. Heather Reilly  Heather Reilly' tolerated her treatment regimen without any significant adverse effects and or reactions presented.   Patient did respond positively to her treatment  regimen.This is evidenced by her daily reports of improved mood, reduction  of symptoms and presentation of good affect/eye contact. She attended treatment team meeting this am and met with her treatment team members. Her reason for admission, present symptoms, response to treatment and discharge plans discussed with patient. Heather Reilly endorsed that her symptoms has stabilized and that she is ready for discharge to pursue psychiatric care on outpatient basis. It was then agreed upon that patient will follow-up care at the [facility] Re[facility] on 08/13/13 at 08:45 am. And for medication management and routine psychiatric care, Heather Reilly will follow-up with the Heather Reilly in [place], [place]. She is aware to call the Heather Reilly services after the Heather Reilly behavioral [facility] for an appointment. The addresses, dates, times and contact information for these clinics provided for patient in writing.  Upon discharge, Heather Reilly adamantly denies any suicidal, homicidal ideations, auditory, visual hallucinations, paranoia and or delusional thoughts. She was provided with 14 days worth supply samples of her [facility] discharge medications. She left Heather Reilly with all personal belongings in no apparent distress. Transportation per self.  Consults:  psychiatry  Significant Diagnostic Studies:  labs: CBC with diff, CMP, UDS, Toxicology tests, U/A  Discharge Vitals:   Blood pressure 101/68, pulse 89, temperature 97.5 F (36.4 C), temperature source Oral, resp. rate 16, height 5' 2.5" (1.588 m), weight 54.205 kg (119 lb 8 oz), last menstrual period 05/14/2013, SpO2 96.00%. Body mass index is 21.5 kg/(m^2). Lab Results:   No results found for this or any previous visit (from the past 72 hour(s)).  Physical Findings: AIMS: Facial and Oral Movements Muscles of Facial Expression: None, normal Lips and Perioral Area: None, normal Jaw: None, normal Tongue: None, normal,Extremity Movements Upper (arms, wrists, hands, fingers): None, normal Lower (legs, knees, ankles, toes): None,  normal, Trunk Movements Neck, shoulders, hips: None, normal, Overall Severity Severity of abnormal movements (highest score from questions above): None, normal Incapacitation due to abnormal movements: None, normal Patient's awareness of abnormal movements (rate only patient's report): No Awareness, Dental Status Current problems with teeth and/or dentures?: No Does patient usually wear dentures?: No  CIWA:  CIWA-Ar Total: 4 COWS:  COWS Total Score: 2  Psychiatric Specialty Exam: See Psychiatric Specialty Exam and Suicide Risk Assessment completed by Attending Physician prior to discharge.  Discharge destination:  Home  Is patient on multiple antipsychotic therapies at discharge:  No   Has Patient had three or more failed trials of antipsychotic monotherapy by history:  No  Recommended Plan for Multiple Antipsychotic Therapies: NA     Medication List    STOP taking these medications       diphenhydrAMINE 25 mg capsule  Commonly known as:  BENADRYL     gabapentin 300 MG capsule  Commonly known as:  NEURONTIN     ziprasidone 60 MG capsule  Commonly known as:  GEODON      TAKE these medications     Indication   hydrOXYzine 50 MG tablet  Commonly known as:  ATARAX/VISTARIL  Take 1 tablet (50 mg) three times daily for anxiety/tension   Indication:  Anxiety associated with Organic Disease     QUEtiapine 50 MG tablet  Commonly known as:  SEROQUEL  Take 3 tablets (150 mg total) by mouth at bedtime. For mood control   Indication:  Trouble Sleeping, Mood control     traZODone 50 MG tablet  Commonly known as:  DESYREL  Take 1 tablet (50 mg total) by mouth  at bedtime as needed for sleep (may repeat X 1). For sleep   Indication:  Trouble Sleeping     Vilazodone HCl 10 MG Tabs  Commonly known as:  VIIBRYD  Take 2 tablets (20 mg total) by mouth daily. For depression   Indication:  Major Depressive Disorder       Follow-up Information   Follow up with [facility] On 08/13/2013. (Arrive at 8:45 am on this date for intake assessment.  Groups are Monday - Friday 9 am - 12 pm.  )    Contact information:   [address] [place], [place] [address] Phone: [phone]      Follow up with [facility]. (Call to schedule appointments for medication management and therapy after discharge from IOP)    Contact information:   [address] [place], [place] [address] Phone: [phone] Fax: [phone]     Follow-up recommendations: Activity:  As tolerated Diet: As recommended by your primary care doctor. Keep all scheduled follow-up appointments as recommended.  Comments:  Take all your medications as prescribed by your mental healthcare provider. Report any adverse effects and or reactions from your medicines to your outpatient provider promptly. Patient is instructed and cautioned to not engage in alcohol and or illegal drug use while on prescription medicines. In the event of worsening symptoms, patient is instructed to call the crisis hotline, 911 and or go to the nearest [facility] for appropriate evaluation and treatment of symptoms. Follow-up with your primary care provider for your other medical issues, concerns and or Re[facility] care needs.   Total Discharge Time:  Greater than 30 minutes.  Signed: Sanjuana Kava, PMHNP, FNP-BC 08/04/2013, 12:12 PM Seen and agreed. Thedore Mins, MD

## 2013-08-04 NOTE — Progress Notes (Signed)
Adult Psychoeducational Group Note  Date:  08/04/2013 Time:  1:23 PM  Group Topic/Focus:  Self-Care Assessment  Participation Level:  Active  Participation Quality:  Appropriate, Sharing and Supportive  Affect:  Appropriate  Cognitive:  Alert and Appropriate  Insight: Appropriate  Engagement in Group:  Engaged and Supportive  Modes of Intervention:  Discussion, Education and Exploration  Additional Comments: During this group Writer discussed the importance of Wellness and Self care. Patients had the opportunity to complete self care assessment in daily packet and discuss the results of assessment. Patients and Clinical research associate discussed the importance of working on the weak parts of the assessment and how to improve them   Lauralee Evener 08/04/2013, 1:23 PM

## 2013-08-04 NOTE — Progress Notes (Signed)
Adult Psychoeducational Group Note  Date:  08/04/2013 Time:  10:00AM Group Topic/Focus:  Therapuetic Activity  Participation Level:  Active  Participation Quality:  Appropriate and Attentive  Affect:  Appropriate  Cognitive:  Alert and Appropriate  Insight: Appropriate  Engagement in Group:  Engaged  Modes of Intervention:  Discussion  Additional Comments:  Pt. Was attentive and appropriate during today's therapeutic activity. Pt was able to discuss developing a wellness toolbox. Pt. Shared how she is going to be able to be more independent and talk about her problems instead of letting things build up and isolating herself.    Bing Plume D 08/04/2013, 1:05 PM

## 2013-08-04 NOTE — Progress Notes (Signed)
D: pt stated that today was a good day. Pt attended groups and stated that she enjoyed them. Pt is calm and cooperative. Appropriate to situation. Denies SI/HI/AVH. Denies Pain A: q 15 min safety checks. Seroquel given around 2230. 1:1 time offered.  R: pt remains on safe on unit. No further complaints or signs of distress at this time.

## 2013-08-04 NOTE — Tx Team (Signed)
Interdisciplinary Treatment Plan Update (Adult)  Date: 08/04/2013  Time Reviewed:  9:45 AM  Progress in Treatment: Attending groups: Yes Participating in groups:  Yes Taking medication as prescribed:  Yes Tolerating medication:  Yes Family/Significant othe contact made: No, pt refused Patient understands diagnosis:  Yes Discussing patient identified problems/goals with staff:  Yes Medical problems stabilized or resolved:  Yes Denies suicidal/homicidal ideation: Yes Issues/concerns per patient self-inventory:  Yes Other:  New problem(s) identified: none   Discharge Plan or Barriers: Pt has follow up scheduled at [facility] for IOP and [facility] for medication management and therapy.    Reason for Continuation of Hospitalization: Goal met  Comments: N/A  Estimated length of stay:DC home today.  For review of initial/current patient goals, please see plan of care.  Attendees: Patient:     Family:     Physician:  Dr. Dub Mikes 08/04/2013 11:45 AM   Nursing:   Malva Limes, RN  08/04/2013 11:45 AM   Clinical Social Worker:  Reyes Ivan, LCSW 08/04/2013 11:45 AM   Other: Onnie Boer, RN case manager 08/04/2013 11:45 AM   Other:  Trula Slade, LCSWA 08/04/2013 11:45 AM   Other:  Serena Colonel, NP 08/04/2013 11:45 AM   Other:  Ashley Jacobs, LCSW   Other:    Other:    Other:    Other:    Other:    Other:     Scribe for Treatment Team:   Carmina Miller, 08/04/2013 , 11:45 AM

## 2013-08-04 NOTE — Progress Notes (Signed)
[facility] Adult Case Management Discharge Plan :  Will you be returning to the same living situation after discharge: Yes,  home with family At discharge, do you have transportation home?:Yes,  will drive self, car is at [facility] Do you have the ability to pay for your medications:Yes,  no barriers  Release of information consent forms completed and in the chart;  Patient's signature needed at discharge.  Patient to Follow up at: Follow-up Information   Follow up with [facility] On 08/13/2013. (Arrive at 8:45 am on this date for intake assessment.  Groups are Monday - Friday 9 am - 12 pm.  )    Contact information:   [address] [place], [place] [address] Phone: [phone]      Follow up with [facility]. (Call to schedule appointments for medication management and therapy after discharge from IOP)    Contact information:   [address] [place], [place] [address] Phone: [phone] Fax: [phone]      Patient denies SI/HI:   Yes,  no reports of SI    Safety Planning and Suicide Prevention discussed:  Yes,  with patient as she refused to have supports called.  Nail, Catalina Gravel 08/04/2013, 11:46 AM

## 2013-08-05 NOTE — Progress Notes (Signed)
Patient Discharge Instructions:  After Visit Summary (AVS):   Faxed to:  08/05/13 Psychiatric Admission Assessment Note:   Faxed to:  08/05/13 Suicide Risk Assessment - Discharge Assessment:   Faxed to:  08/05/13 Faxed/Sent to the Next Level Care provider:  08/05/13 Next Level Care Provider Has Access to the EMR, 08/05/13 Faxed to [facility] @ [phone] Records provided to [facility] via [facility]/Epic access.  Jerelene Redden, 08/05/2013, 3:33 PM

## 2013-08-20 ENCOUNTER — Other Ambulatory Visit (HOSPITAL_COMMUNITY): Payer: BC Managed Care – PPO | Admitting: Psychiatry

## 2013-08-20 DIAGNOSIS — F331 Major depressive disorder, recurrent, moderate: Secondary | ICD-10-CM | POA: Insufficient documentation

## 2013-08-21 ENCOUNTER — Other Ambulatory Visit (HOSPITAL_COMMUNITY): Payer: BC Managed Care – PPO

## 2013-08-22 ENCOUNTER — Other Ambulatory Visit (HOSPITAL_COMMUNITY): Payer: BC Managed Care – PPO | Attending: Psychiatry

## 2013-08-22 NOTE — Progress Notes (Signed)
    Daily Group Progress Note  Program: IOP  Group Time: 9:00-10:30 am   Participation Level: Active  Behavioral Response: Appropriate  Type of Therapy:  Process Group  Summary of Progress: Pt was meeting with the case manager to complete th initial orientation and assessment.      Group Time: 10:30 am - 12:00 pm   Participation Level:  Active  Behavioral Response: Appropriate  Type of Therapy: Psycho-education Group  Summary of Progress: pt was meeting with the psychiatrist for the initial medical assessment.   Carman ChingENGLEHORN,Davi Kroon E, LCSW

## 2013-08-25 ENCOUNTER — Other Ambulatory Visit (HOSPITAL_COMMUNITY): Payer: BC Managed Care – PPO

## 2013-08-26 ENCOUNTER — Other Ambulatory Visit (HOSPITAL_COMMUNITY): Payer: BC Managed Care – PPO

## 2013-08-27 ENCOUNTER — Other Ambulatory Visit (HOSPITAL_COMMUNITY): Payer: BC Managed Care – PPO

## 2013-08-28 ENCOUNTER — Other Ambulatory Visit (HOSPITAL_COMMUNITY): Payer: BC Managed Care – PPO

## 2013-08-29 ENCOUNTER — Other Ambulatory Visit (HOSPITAL_COMMUNITY): Payer: BC Managed Care – PPO

## 2013-09-01 ENCOUNTER — Other Ambulatory Visit (HOSPITAL_COMMUNITY): Payer: BC Managed Care – PPO

## 2013-09-02 ENCOUNTER — Other Ambulatory Visit (HOSPITAL_COMMUNITY): Payer: BC Managed Care – PPO

## 2013-09-03 ENCOUNTER — Other Ambulatory Visit (HOSPITAL_COMMUNITY): Payer: BC Managed Care – PPO

## 2013-09-04 ENCOUNTER — Other Ambulatory Visit (HOSPITAL_COMMUNITY): Payer: BC Managed Care – PPO

## 2013-09-05 ENCOUNTER — Other Ambulatory Visit (HOSPITAL_COMMUNITY): Payer: BC Managed Care – PPO

## 2013-09-08 ENCOUNTER — Other Ambulatory Visit (HOSPITAL_COMMUNITY): Payer: BC Managed Care – PPO

## 2013-09-09 ENCOUNTER — Other Ambulatory Visit (HOSPITAL_COMMUNITY): Payer: BC Managed Care – PPO

## 2013-09-10 ENCOUNTER — Other Ambulatory Visit (HOSPITAL_COMMUNITY): Payer: BC Managed Care – PPO

## 2013-09-11 ENCOUNTER — Other Ambulatory Visit (HOSPITAL_COMMUNITY): Payer: BC Managed Care – PPO

## 2013-09-12 ENCOUNTER — Other Ambulatory Visit (HOSPITAL_COMMUNITY): Payer: BC Managed Care – PPO

## 2013-09-15 ENCOUNTER — Other Ambulatory Visit (HOSPITAL_COMMUNITY): Payer: BC Managed Care – PPO

## 2013-09-16 ENCOUNTER — Other Ambulatory Visit (HOSPITAL_COMMUNITY): Payer: BC Managed Care – PPO

## 2013-09-17 ENCOUNTER — Other Ambulatory Visit (HOSPITAL_COMMUNITY): Payer: BC Managed Care – PPO

## 2013-09-18 ENCOUNTER — Other Ambulatory Visit (HOSPITAL_COMMUNITY): Payer: BC Managed Care – PPO

## 2013-09-19 ENCOUNTER — Other Ambulatory Visit (HOSPITAL_COMMUNITY): Payer: BC Managed Care – PPO

## 2013-09-22 ENCOUNTER — Other Ambulatory Visit (HOSPITAL_COMMUNITY): Payer: BC Managed Care – PPO

## 2013-09-23 ENCOUNTER — Other Ambulatory Visit (HOSPITAL_COMMUNITY): Payer: BC Managed Care – PPO

## 2013-09-24 ENCOUNTER — Other Ambulatory Visit (HOSPITAL_COMMUNITY): Payer: BC Managed Care – PPO

## 2013-09-25 ENCOUNTER — Other Ambulatory Visit (HOSPITAL_COMMUNITY): Payer: BC Managed Care – PPO

## 2013-09-26 ENCOUNTER — Other Ambulatory Visit (HOSPITAL_COMMUNITY): Payer: BC Managed Care – PPO

## 2013-09-29 ENCOUNTER — Other Ambulatory Visit (HOSPITAL_COMMUNITY): Payer: BC Managed Care – PPO

## 2013-09-30 ENCOUNTER — Other Ambulatory Visit (HOSPITAL_COMMUNITY): Payer: BC Managed Care – PPO

## 2014-03-14 ENCOUNTER — Ambulatory Visit (INDEPENDENT_AMBULATORY_CARE_PROVIDER_SITE_OTHER): Payer: Self-pay | Admitting: Internal Medicine

## 2014-03-14 VITALS — BP 108/64 | HR 68 | Temp 98.0°F | Resp 12 | Ht 62.0 in | Wt 112.0 lb

## 2014-03-14 DIAGNOSIS — L659 Nonscarring hair loss, unspecified: Secondary | ICD-10-CM

## 2014-03-14 DIAGNOSIS — F329 Major depressive disorder, single episode, unspecified: Secondary | ICD-10-CM

## 2014-03-14 DIAGNOSIS — F411 Generalized anxiety disorder: Secondary | ICD-10-CM

## 2014-03-14 DIAGNOSIS — F3289 Other specified depressive episodes: Secondary | ICD-10-CM

## 2014-03-14 DIAGNOSIS — R634 Abnormal weight loss: Secondary | ICD-10-CM

## 2014-03-14 DIAGNOSIS — G4700 Insomnia, unspecified: Secondary | ICD-10-CM

## 2014-03-14 DIAGNOSIS — N951 Menopausal and female climacteric states: Secondary | ICD-10-CM

## 2014-03-14 DIAGNOSIS — F32A Depression, unspecified: Secondary | ICD-10-CM

## 2014-03-14 LAB — COMPLETE METABOLIC PANEL WITH GFR
ALT: 15 U/L (ref 0–35)
AST: 19 U/L (ref 0–37)
Albumin: 4.5 g/dL (ref 3.5–5.2)
Alkaline Phosphatase: 85 U/L (ref 39–117)
BUN: 12 mg/dL (ref 6–23)
CALCIUM: 9.6 mg/dL (ref 8.4–10.5)
CHLORIDE: 104 meq/L (ref 96–112)
CO2: 27 mEq/L (ref 19–32)
Creat: 0.68 mg/dL (ref 0.50–1.10)
Glucose, Bld: 86 mg/dL (ref 70–99)
Potassium: 4.3 mEq/L (ref 3.5–5.3)
Sodium: 138 mEq/L (ref 135–145)
Total Bilirubin: 0.5 mg/dL (ref 0.2–1.2)
Total Protein: 7.1 g/dL (ref 6.0–8.3)

## 2014-03-14 LAB — POCT CBC
Granulocyte percent: 59.4 %G (ref 37–80)
HCT, POC: 45.7 % (ref 37.7–47.9)
Hemoglobin: 15.2 g/dL (ref 12.2–16.2)
Lymph, poc: 2.2 (ref 0.6–3.4)
MCH: 30.9 pg (ref 27–31.2)
MCHC: 33.2 g/dL (ref 31.8–35.4)
MCV: 93.2 fL (ref 80–97)
MID (CBC): 0.3 (ref 0–0.9)
MPV: 8.9 fL (ref 0–99.8)
POC Granulocyte: 3.6 (ref 2–6.9)
POC LYMPH PERCENT: 36.1 %L (ref 10–50)
POC MID %: 4.5 %M (ref 0–12)
Platelet Count, POC: 224 10*3/uL (ref 142–424)
RBC: 4.91 M/uL (ref 4.04–5.48)
RDW, POC: 12.3 %
WBC: 6 10*3/uL (ref 4.6–10.2)

## 2014-03-14 LAB — TSH: TSH: 0.954 u[IU]/mL (ref 0.350–4.500)

## 2014-03-14 MED ORDER — ESCITALOPRAM OXALATE 10 MG PO TABS: 10.0000 mg | ORAL_TABLET | Freq: Every day | ORAL | Status: DC

## 2014-03-14 MED ORDER — HYDROXYZINE HCL 50 MG PO TABS: ORAL_TABLET | ORAL | Status: DC

## 2014-03-14 MED ORDER — ALPRAZOLAM 0.5 MG PO TABS: 0.5000 mg | ORAL_TABLET | Freq: Every day | ORAL | Status: DC

## 2014-03-14 NOTE — Progress Notes (Signed)
Subjective:  This chart was scribed for Heather Sia, MD by Heather Reilly, Medical Scribe. This patient was seen in Room 10 and the patient's care was started at 11:00 AM.   Patient ID: Heather Reilly, female    DOB: [birth year], 48 y.o.   MRN: [id number]  HPI HPI Comments: Heather Reilly is a 48 y.o. female with a history of depression and anxiety who presents to the [facility] complaining of severe, gradually worsening depression and anxiety that started 5 years ago.  She was divorced 7 years ago and still has not gotten over the separation.  She is still in contact with her ex-husband.  She recently had to move out of her house and has not unpacked anything.  She states that normally she works from "sun up to sun down" but after the divorce her expenses did not allow her to keep up with the house.  She currently works for [employer] and enjoys her job.  She is still going to work but feels empty as like she is not functioning.  She does not sleep well but lies down all the time and worries.  She does not endorse fatigue.  She feels a lot of anxiety and states that it causes her to avoid going places.  She does not think about killing herself but states that she wants to die.  Her sister states that the patient has lost about 30 pounds in the last 6 months.  She was prescribed Seroquel, Desyrel, and Viibryd last year by [facility].  She was hospitalized for her depression twice in the past and has never experienced any symptoms that would indicate she has Bipolar Disorder.  She has taken Xanax in the past which helped her sleep.  She has taken Trazodone in the past but it did not help her sleep.  She has not had any OCD symptoms lately.  She does not have any medical insurance.  She has a 15 year old child that is living with her and an older son living in [place].  She states that her child does not give her any problems.  Her sisters states that her niece is doing  well and is still contact with friends.  She has not had any drastic decrease in her grades since her parents initially separated.  She states that she does not have any friends and has become more isolated over the last 7 years.  She does not have problems with substance abuse currently.  She has a family history of OCD and depression.  She has 6 sisters and 7 brothers.  Her father just passed away and her mother is living at home.  She got along well with her mother even though her mother has criticized her in the past.  Some of her family has served as a source of support.  Discussion with the patient alone reveals that she thinks her family has no understanding of why she is going through and have failed to pay any attention to her terrible progression into the depths of depression over the past 7 years. She feels that she had everything she needed in her 45s and early 47s and now has lost everything. She is significantly lonely. She cannot manage to do anything for fun, barely gets out of the house except to go to work. Over the past 6 but she has lost 20 pounds from being disinterested in food. She also worries about a possible metabolic  problem. She would prefer not to be a live because she is so miserable, but she has not suicidal and has no suicide ideation even as she believes strongly she needs to stayalive for her 43 year old daughter.  Last menstrual period about one year ago when she first started to lose weight/not sexually active in many years No recent alcohol or drug use at any type since her hospitalization  Past Medical History  Diagnosis Date  . Anxiety   . Depression    Past Surgical History  Procedure Laterality Date  . No past surgeries     No family history on file. History   Social History  . Marital Status: Married    Spouse Name: N/A    Number of Children: N/A  . Years of Education: N/A   Occupational History  . Not on file.   Social History Main Topics  .  Smoking status: Former Smoker -- 0.50 packs/day for .5 years    Types: Cigarettes  . Smokeless tobacco: Never Used  . Alcohol Use: Yes     Comment: one marjuarita every 2 months  . Drug Use: Yes    Special: Benzodiazepines  . Sexual Activity: Not on file   Other Topics Concern  . Not on file   Social History Narrative  . No narrative on file   Allergies  Allergen Reactions  . Penicillins Hives, Shortness Of Breath and Itching  . Sulfa Antibiotics Hives and Itching    Review of Systems  Constitutional: Positive for activity change, appetite change and fatigue. Negative for fever.  HENT: Negative for dental problem and nosebleeds.   Eyes: Negative for visual disturbance.  Respiratory: Negative for shortness of breath.   Cardiovascular: Negative for chest pain, palpitations and leg swelling.  Gastrointestinal: Negative for vomiting, abdominal pain, diarrhea and constipation.  Endocrine: Negative for polydipsia, polyphagia and polyuria.  Genitourinary: Negative for difficulty urinating.  Musculoskeletal: Negative for arthralgias.  Neurological: Positive for weakness. Negative for light-headedness and headaches.  Hematological: Does not bruise/bleed easily.  Psychiatric/Behavioral: Positive for behavioral problems, sleep disturbance, dysphoric mood, decreased concentration and agitation. Negative for suicidal ideas. The patient is nervous/anxious.      Objective:  Physical Exam  Nursing note and vitals reviewed. Constitutional: She is oriented to person, place, and time. She appears well-developed and well-nourished. She appears distressed.  HENT:  Head: Normocephalic and atraumatic.  Eyes: Conjunctivae and EOM are normal. Pupils are equal, round, and reactive to light.  Neck: Neck supple. No thyromegaly present.  Cardiovascular: Normal rate, regular rhythm and normal heart sounds.   Pulmonary/Chest: Effort normal and breath sounds normal.  Musculoskeletal: She exhibits no  edema.  Lymphadenopathy:    She has no cervical adenopathy.  Neurological: She is alert and oriented to person, place, and time. No cranial nerve deficit.  Skin:  Hair appears thin but there is no area of alopecia  Psychiatric:  Her mood is significantly depressed and her behavior is abnormal because of this. Her thought content is dominated by her misery. Her judgment is not entirely sound at this point and she is unable to make decisions about anything.    ov BP 108/64  Pulse 68  Temp(Src) 98 F (36.7 C) (Oral)  Resp 12  Ht 5\' 2"  (1.575 m)  Wt 112 lb (50.803 kg)  BMI 20.48 kg/m2  SpO2 99%  LMP 05/14/2013 Assessment & Plan:  Abnormal weight loss - Depression - Anxiety state, Insomnia - Hair loss Menopausal state Malnourished Poor financial  situation/uninsured  Screening labs Start at least one outside activity to be arranged by sister At least one activity each week for fun with daughter Fatigue and church group for post- divorce trauma  Meds ordered this encounter  Medications  . hydrOXYzine (ATARAX/VISTARIL) 50 MG tablet    Sig: Take 1 tablet (50 mg) three times daily for anxiety/tension    Dispense:  30 tablet    Refill:  0  . escitalopram (LEXAPRO) 10 MG tablet    Sig: Take 1 tablet (10 mg total) by mouth daily.    Dispense:  30 tablet    Refill:  0  . ALPRAZolam (XANAX) 0.5 MG tablet    Sig: Take 1-2 tablets (0.5-1 mg total) by mouth at bedtime.    Dispense:  30 tablet    Refill:  1  f/u 3 weeks  I have completed the patient encounter in its entirety as documented by the scribe, with editing by me where necessary. Carah Barrientes P. Merla Richesoolittle, M.D.

## 2014-03-31 ENCOUNTER — Telehealth: Payer: Self-pay

## 2014-03-31 NOTE — Telephone Encounter (Signed)
lmom for  Pt to cb.  What is the issue with each medication? Medications were prescribed om 03/14/2014 It take 6 weeks at maximum dosage for lexapro to become effective.

## 2014-03-31 NOTE — Telephone Encounter (Signed)
°  PT STATES THE LEXAPRO AND XANAX ISN'T HELPING AT ALL, SHE HASN'T BEEN ABLE TO SLEEP AND NEED TO HAVE SOMETHING ELSE CALLED IN. PLEASE CALL [phone] CAN ONLY GET A CALL BETWEEN 1: 00 AND 2:00

## 2014-04-02 NOTE — Telephone Encounter (Signed)
LM for rtn call. 

## 2014-04-03 NOTE — Telephone Encounter (Signed)
Pt has been taking 2 tablets of the xanax each night and she is not getting any sleep. She is out of Xanax now. She had to take 2 mg in order to get a good night sleep. She is also not feeling any effects of the Lexapro. She states life just any worse.  She can not take Ambien due to hallucinations. Clonazepam did not help her. Celexa was not effective for him. She states it needs something strong and it needs to be cheap.  I have printed this list to review.

## 2014-04-03 NOTE — Telephone Encounter (Signed)
[phone]    Patient is returning call. Please call back at the number listed in the beginning of this message.

## 2014-04-05 MED ORDER — TRAZODONE HCL 50 MG PO TABS: 25.0000 mg | ORAL_TABLET | Freq: Every evening | ORAL | Status: DC | PRN

## 2014-04-05 NOTE — Telephone Encounter (Signed)
Trial Meds ordered this encounter  Medications  . traZODone (DESYREL) 50 MG tablet    Sig: Take 0.5-1 tablets (25-50 mg total) by mouth at bedtime as needed for sleep.    Dispense:  30 tablet    Refill:  3

## 2014-04-06 NOTE — Telephone Encounter (Signed)
Left detailed message on pt vm that medication has been refilled.

## 2014-04-07 ENCOUNTER — Telehealth: Payer: Self-pay

## 2014-04-07 NOTE — Telephone Encounter (Signed)
Changed pharmacy to [facility]- tried to reach pt at phone number provided- wrong number. Called cell phone- Lm for rtn call.

## 2014-04-07 NOTE — Telephone Encounter (Signed)
Pt states her medication was called into the wrong pharmacy and also the Trazodone s doesn't help her sleep at all Please call [phone] ext [phone] and please be discreet        [facility] [address]

## 2014-04-11 NOTE — Telephone Encounter (Signed)
Left message on machine to call back  

## 2014-04-12 NOTE — Telephone Encounter (Signed)
Left message on machine to call back  

## 2014-05-07 ENCOUNTER — Encounter (HOSPITAL_COMMUNITY): Payer: Self-pay | Admitting: Emergency Medicine

## 2014-05-07 ENCOUNTER — Emergency Department (HOSPITAL_COMMUNITY)
Admission: EM | Admit: 2014-05-07 | Discharge: 2014-05-09 | Disposition: A | Discharge status: Other Acute Inpatient Hospital | Payer: BC Managed Care – PPO | Attending: Emergency Medicine | Admitting: Emergency Medicine

## 2014-05-07 DIAGNOSIS — Z046 Encounter for general psychiatric examination, requested by authority: Secondary | ICD-10-CM | POA: Insufficient documentation

## 2014-05-07 DIAGNOSIS — F3289 Other specified depressive episodes: Secondary | ICD-10-CM | POA: Insufficient documentation

## 2014-05-07 DIAGNOSIS — R45851 Suicidal ideations: Secondary | ICD-10-CM | POA: Insufficient documentation

## 2014-05-07 DIAGNOSIS — G478 Other sleep disorders: Secondary | ICD-10-CM | POA: Insufficient documentation

## 2014-05-07 DIAGNOSIS — F1310 Sedative, hypnotic or anxiolytic abuse, uncomplicated: Secondary | ICD-10-CM

## 2014-05-07 DIAGNOSIS — F331 Major depressive disorder, recurrent, moderate: Secondary | ICD-10-CM | POA: Diagnosis present

## 2014-05-07 DIAGNOSIS — F329 Major depressive disorder, single episode, unspecified: Secondary | ICD-10-CM | POA: Insufficient documentation

## 2014-05-07 DIAGNOSIS — F332 Major depressive disorder, recurrent severe without psychotic features: Secondary | ICD-10-CM

## 2014-05-07 DIAGNOSIS — F411 Generalized anxiety disorder: Secondary | ICD-10-CM | POA: Insufficient documentation

## 2014-05-07 DIAGNOSIS — Z87891 Personal history of nicotine dependence: Secondary | ICD-10-CM | POA: Insufficient documentation

## 2014-05-07 DIAGNOSIS — Z880 Allergy status to penicillin: Secondary | ICD-10-CM | POA: Insufficient documentation

## 2014-05-07 DIAGNOSIS — F32A Depression, unspecified: Secondary | ICD-10-CM

## 2014-05-07 LAB — ACETAMINOPHEN LEVEL: Acetaminophen (Tylenol), Serum: <15 ug/mL (ref 10–30)

## 2014-05-07 LAB — CBC
HEMATOCRIT: 42.4 % (ref 36.0–46.0)
Hemoglobin: 14.7 g/dL (ref 12.0–15.0)
MCH: 30.4 pg (ref 26.0–34.0)
MCHC: 34.7 g/dL (ref 30.0–36.0)
MCV: 87.6 fL (ref 78.0–100.0)
Platelets: 253 10*3/uL (ref 150–400)
RBC: 4.84 MIL/uL (ref 3.87–5.11)
RDW: 12 % (ref 11.5–15.5)
WBC: 6.8 10*3/uL (ref 4.0–10.5)

## 2014-05-07 LAB — COMPREHENSIVE METABOLIC PANEL
ALK PHOS: 109 U/L (ref 39–117)
ALT: 19 U/L (ref 0–35)
AST: 22 U/L (ref 0–37)
Albumin: 4.2 g/dL (ref 3.5–5.2)
Anion gap: 14 (ref 5–15)
BUN: 10 mg/dL (ref 6–23)
CHLORIDE: 99 meq/L (ref 96–112)
CO2: 26 mEq/L (ref 19–32)
CREATININE: 0.64 mg/dL (ref 0.50–1.10)
Calcium: 9.7 mg/dL (ref 8.4–10.5)
GFR calc Af Amer: >90 mL/min (ref >90)
Glucose, Bld: 109 mg/dL — ABNORMAL HIGH (ref 70–99)
POTASSIUM: 4 meq/L (ref 3.7–5.3)
Sodium: 139 mEq/L (ref 137–147)
Total Bilirubin: 0.4 mg/dL (ref 0.3–1.2)
Total Protein: 8.2 g/dL (ref 6.0–8.3)

## 2014-05-07 LAB — RAPID URINE DRUG SCREEN, HOSP PERFORMED
Amphetamines: NOT DETECTED
BENZODIAZEPINES: NOT DETECTED
Barbiturates: NOT DETECTED
Cocaine: NOT DETECTED
Opiates: NOT DETECTED
TETRAHYDROCANNABINOL: NOT DETECTED

## 2014-05-07 LAB — SALICYLATE LEVEL: Salicylate Lvl: <2 mg/dL — ABNORMAL LOW (ref 2.8–20.0)

## 2014-05-07 LAB — ETHANOL

## 2014-05-07 MED ORDER — ONDANSETRON HCL 4 MG PO TABS
4.0000 mg | ORAL_TABLET | Freq: Three times a day (TID) | ORAL | Status: DC | PRN
  Administered 2014-05-08: 4 mg via ORAL
  Filled 2014-05-07: qty 1

## 2014-05-07 MED ORDER — LORAZEPAM 1 MG PO TABS
1.0000 mg | ORAL_TABLET | Freq: Three times a day (TID) | ORAL | Status: DC | PRN
  Administered 2014-05-07 – 2014-05-08 (×2): 1 mg via ORAL
  Filled 2014-05-07 (×2): qty 1

## 2014-05-07 MED ORDER — NICOTINE 21 MG/24HR TD PT24
21.0000 mg | MEDICATED_PATCH | Freq: Every day | TRANSDERMAL | Status: DC
  Filled 2014-05-07: qty 1

## 2014-05-07 MED ORDER — ALUM & MAG HYDROXIDE-SIMETH 200-200-20 MG/5ML PO SUSP: 30.0000 mL | ORAL | Status: DC | PRN

## 2014-05-07 MED ORDER — IBUPROFEN 200 MG PO TABS
600.0000 mg | ORAL_TABLET | Freq: Three times a day (TID) | ORAL | Status: DC | PRN
  Administered 2014-05-07 – 2014-05-08 (×2): 600 mg via ORAL
  Filled 2014-05-07 (×2): qty 3

## 2014-05-07 MED ORDER — ZOLPIDEM TARTRATE 5 MG PO TABS: 5.0000 mg | ORAL_TABLET | Freq: Every evening | ORAL | Status: DC | PRN

## 2014-05-07 NOTE — ED Notes (Signed)
Patient has one bag of belongings in locker 26. In her belongings in a cell phone that has zte on the back of it.

## 2014-05-07 NOTE — ED Notes (Signed)
PA at bedside.

## 2014-05-07 NOTE — ED Provider Notes (Signed)
CSN: [id number]     Arrival date & time 05/07/14  1630 History   First MD Initiated Contact with Patient 05/07/14 1748     Chief Complaint  Patient presents with  . IVC      (Consider location/radiation/quality/duration/timing/severity/associated sxs/prior Treatment) The history is provided by the patient and medical records. No language interpreter was used.    Heather Reilly is a 48 y.o. female  with a hx of depression, anxiety presents to the Emergency Department via [facility] with IVC paperwork complaining of gradual, persistent, progressively worsening depression onset 6 years ago, significantly worsening in the last year.  Pt reports she has lost her job, her husband left her and she was depressed for several years, but was doing OK.  She lost her house and then her son and daughter moved away and she recently lost her new job.  Pt states "My life is crap, I'm unable to function, I want to die." NO aggravating or alleviating factors.  Pt has been taking Prozac for 2 days without relief.  Pt denies all somatic symptoms.  She reports she doesn't want to cook, shower or leave the house.  Pt reports she feels suicidal, but denies HI, auditory or visual hallucinations.  Pt reports she would use a gun to kill herself or drink liquid plumber.  Last hospitalization was 08/05/14.  Pt reports this didn't help her.    Pt denies EtOH usage or illicit drug use.   Pt reports she is seeing a Librarian, academic.     Past Medical History  Diagnosis Date  . Anxiety   . Depression    Past Surgical History  Procedure Laterality Date  . No past surgeries     No family history on file. History  Substance Use Topics  . Smoking status: Former Smoker -- 0.50 packs/day for .5 years    Types: Cigarettes  . Smokeless tobacco: Never Used  . Alcohol Use: Yes     Comment: one marjuarita every 2 months   OB History   Grav Para Term Preterm Abortions TAB SAB Ect Mult Living                 Review of Systems   Constitutional: Negative for fever, diaphoresis, appetite change, fatigue and unexpected weight change.  HENT: Negative for mouth sores.   Eyes: Negative for visual disturbance.  Respiratory: Negative for cough, chest tightness, shortness of breath and wheezing.   Cardiovascular: Negative for chest pain.  Gastrointestinal: Negative for nausea, vomiting, abdominal pain, diarrhea and constipation.  Endocrine: Negative for polydipsia, polyphagia and polyuria.  Genitourinary: Negative for dysuria, urgency, frequency and hematuria.  Musculoskeletal: Negative for back pain and neck stiffness.  Skin: Negative for rash.  Allergic/Immunologic: Negative for immunocompromised state.  Neurological: Negative for syncope, light-headedness and headaches.  Hematological: Does not bruise/bleed easily.  Psychiatric/Behavioral: Positive for suicidal ideas, sleep disturbance and dysphoric mood. The patient is not nervous/anxious.       Allergies  Penicillins and Sulfa antibiotics  Home Medications   Prior to Admission medications   Medication Sig Start Date End Date Taking? Authorizing Provider  FLUoxetine HCl (PROZAC PO) Take 1 capsule by mouth daily.   Yes Historical Provider, MD  ZOLPIDEM TARTRATE PO Take 1 tablet by mouth at bedtime as needed (sleep.).   Yes Historical Provider, MD   BP 146/74  Pulse 80  Temp(Src) 98.3 F (36.8 C) (Oral)  Resp 20  SpO2 99%  LMP 05/14/2013 Physical Exam  Nursing note  and vitals reviewed. Constitutional: She appears well-developed and well-nourished. No distress.  Awake, alert, nontoxic appearance  HENT:  Head: Normocephalic and atraumatic.  Mouth/Throat: Oropharynx is clear and moist. No oropharyngeal exudate.  Eyes: Conjunctivae are normal. No scleral icterus.  Neck: Normal range of motion. Neck supple.  Cardiovascular: Normal rate, regular rhythm, normal heart sounds and intact distal pulses.   No murmur heard. Pulmonary/Chest: Effort normal and  breath sounds normal. No respiratory distress. She has no wheezes.  Equal chest expansion  Abdominal: Soft. Bowel sounds are normal. She exhibits no mass. There is no tenderness. There is no rebound and no guarding.  Musculoskeletal: Normal range of motion. She exhibits no edema.  Neurological: She is alert.  Speech is clear and goal oriented Moves extremities without ataxia  Skin: Skin is warm and dry. She is not diaphoretic.  Psychiatric: She is not actively hallucinating. She exhibits a depressed mood. She expresses suicidal ideation. She expresses no homicidal ideation. She expresses suicidal plans. She expresses no homicidal plans.    ED Course  Procedures (including critical care time) Labs Review Labs Reviewed - No data to display  Imaging Review No results found.   EKG Interpretation None      MDM   Final diagnoses:  Depression  Suicidal ideation   Heather Reilly presents with depression and SI with a plan.  Hx of depression and hospitalizations.    Nursing note reports ambien overdose, but patient denies this.  She reports taking 6 ambien last night attempting to sleep, but it did not help her sleep.    BP 146/74  Pulse 80  Temp(Src) 98.3 F (36.8 C) (Oral)  Resp 20  SpO2 99%  LMP 05/14/2013  Labs pending; pt without major medical problems.  Will consult TTS.    7:59 PM Labs reassuring.  Pt is medically cleared.     Dahlia Client [name], PA-C 05/07/14 1959

## 2014-05-07 NOTE — Progress Notes (Signed)
  CARE MANAGEMENT ED NOTE 05/07/2014  Patient:  Heather, Reilly   Account Number:  [id number]  Date Initiated:  05/07/2014  Documentation initiated by:  Radford Pax  Subjective/Objective Assessment:   Patient presents to Ed with increasing depression, unable to sleep.  IVC'd     Subjective/Objective Assessment Detail:   Patient with pmhx of depression,     Action/Plan:   Action/Plan Detail:   Anticipated DC Date:       Status Recommendation to Physician:   Result of Recommendation:    Other ED Services  Consult Working Plan    DC Planning Services  Other  PCP issues    Choice offered to / List presented to:            Status of service:  Completed, signed off  ED Comments:   ED Comments Detail:  EDCM spoke to patient at bedside.  Patient confirms she does not hav e [employer] and no pcp.  [facility] provided patient with pamphlet to [facility].  EDCM also provided patient with list of pcps who accept self pay patients, list of discounted pharmacies, list of financial assistance in the community such as [employer], local churches,  urban ministries, phone number ot inquire about the orangs card, phonenumber to inquire about DSS for [employer] and [employer] Act for insurance, and dental assistnce for patients without insurance.  EDCM also providedpatient with list of [place] crisi programs to assist patient with her financial difficulties.  Patient thankdul for resources.  no further EDCM needs at this time.

## 2014-05-07 NOTE — BH Assessment (Signed)
Tele Assessment Note   Heather Reilly is a 48 y.o. female who presents to [facility] via IVC petition, initiated by [employer].  Pt says--"I'm going to die because I'm existing from day to day".  "The days are like 48hrs long". Pt says she told her son that "If I had a gun I'd shoot myself".  Pt reports increased depressive sxs: lethargy, crying spells, insomnia(sleeps 1-2hrs per day), poor appetite(has lost 30pds)  and anxiety.  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  Pt says she been SI x1.5-59yrs and states a specific plan.  Pt says since 2008, she has had a several things happen: divorce, employment, financial issues, lost home, son moved to [place] and pt.'s daughter is now living with her father because pt cannot care for her.   Pt says she has been telling her friends and family that she is going to die for a long time. Pt is unable to contract for safety.     Axis I: Anxiety Disorder NOS and Major Depression, Recurrent severe Axis II: Deferred Axis III:  Past Medical History  Diagnosis Date  . Anxiety   . Depression    Axis IV: other psychosocial or environmental problems, problems related to social environment and problems with primary support group Axis V: 31-40 impairment in reality testing  Past Medical History:  Past Medical History  Diagnosis Date  . Anxiety   . Depression     Past Surgical History  Procedure Laterality Date  . No past surgeries      Family History: No family history on file.  Social History:  reports that she has quit smoking. Her smoking use included Cigarettes. She has a .25 pack-year smoking history. She has never used smokeless tobacco. She reports that she drinks alcohol. She reports that she uses illicit drugs (Benzodiazepines).  Additional Social  History:  Alcohol / Drug Use Pain Medications: See MAR  Prescriptions: See MAR  Over the Counter: See MAR  History of alcohol / drug use?: Yes Longest period of sobriety (when/how long): Hx of 3 month binge  CIWA: CIWA-Ar BP: 93/74 mmHg Pulse Rate: 76 COWS:    PATIENT STRENGTHS: (choose at least two) Communication skills Motivation for treatment/growth  Allergies:  Allergies  Allergen Reactions  . Penicillins Hives, Shortness Of Breath and Itching  . Ambien [Zolpidem Tartrate]     Does not make her sleep  . Sulfa Antibiotics Hives and Itching  . Trazodone And Nefazodone     Does not make her sleep    Home Medications:  (Not in a hospital admission)  OB/GYN Status:  Patient's last menstrual period was 05/14/2013.  General Assessment Data Location of Assessment: [facility] Is this a Tele or Face-to-Face Assessment?: Face-to-Face Is this an Initial Assessment or a Re-assessment for this encounter?: Initial Assessment Living Arrangements: Alone Can pt return to current living arrangement?: Yes Admission Status: Involuntary Is patient capable of signing voluntary admission?: No Transfer from: Acute Hospital Referral Source: MD  Medical Screening Exam [facility] Walk-in ONLY) Medical Exam completed: No Reason for MSE not completed: Other: (None )  [facility] Crisis Care Plan Living Arrangements: Alone Name of Psychiatrist: None  Name of Therapist: None   Education Status Is patient currently in school?: No Current Grade: None  Highest grade of school  patient has completed: None  Name of school: None  Contact person: None   Risk to self with the past 6 months Suicidal Ideation: Yes-Currently Present Suicidal Intent: No-Not Currently/Within Last 6 Months Is patient at risk for suicide?: No Suicidal Plan?: No-Not Currently/Within Last 6 Months Access to Means: Yes Specify Access to Suicidal Means: Sharps, Pills  What has been your use of drugs/alcohol within the last 12  months?: Hx of 3 mon binge on alcohol and pills  Previous Attempts/Gestures: No How many times?: 0 Other Self Harm Risks: None  Triggers for Past Attempts: None known Intentional Self Injurious Behavior: None Family Suicide History: No Recent stressful life event(s): Divorce;Financial Problems;Job Loss Persecutory voices/beliefs?: No Depression: Yes Depression Symptoms: Loss of interest in usual pleasures;Feeling worthless/self pity;Guilt;Fatigue;Isolating;Insomnia Substance abuse history and/or treatment for substance abuse?: Yes Suicide prevention information given to non-admitted patients: Not applicable  Risk to Others within the past 6 months Homicidal Ideation: No Thoughts of Harm to Others: No Current Homicidal Intent: No Current Homicidal Plan: No Access to Homicidal Means: No Identified Victim: None  History of harm to others?: No Assessment of Violence: None Noted Violent Behavior Description: None  Does patient have access to weapons?: No Criminal Charges Pending?: No Does patient have a court date: No  Psychosis Hallucinations: None noted Delusions: None noted  Mental Status Report Appear/Hygiene: Disheveled;In scrubs Eye Contact: Good Motor Activity: Unremarkable Speech: Logical/coherent;Pressured Level of Consciousness: Alert Mood: Depressed;Anxious Affect: Depressed;Anxious Anxiety Level: Minimal Thought Processes: Coherent;Relevant Judgement: Impaired Orientation: Person;Place;Time;Situation Obsessive Compulsive Thoughts/Behaviors: None  Cognitive Functioning Concentration: Normal Memory: Recent Intact;Remote Intact IQ: Average Insight: Poor Impulse Control: Fair Appetite: Poor Weight Loss: 30 Weight Gain: 0 Sleep: Decreased Total Hours of Sleep: 2 Vegetative Symptoms: Staying in bed;Decreased grooming;Not bathing  ADLScreening [facility]) Patient's cognitive ability adequate to safely complete daily activities?: Yes Patient  able to express need for assistance with ADLs?: Yes Independently performs ADLs?: Yes (appropriate for developmental age)  Prior Inpatient Therapy Prior Inpatient Therapy: Yes Prior Therapy Dates: 2014 Prior Therapy Facilty/Provider(s): [facility]  Reason for Treatment: Depression/SI   Prior Outpatient Therapy Prior Outpatient Therapy: No Prior Therapy Dates: None  Prior Therapy Facilty/Provider(s): None  Reason for Treatment: None   ADL Screening (condition at time of admission) Patient's cognitive ability adequate to safely complete daily activities?: Yes Is the patient deaf or have difficulty hearing?: No Does the patient have difficulty seeing, even when wearing glasses/contacts?: No Does the patient have difficulty concentrating, remembering, or making decisions?: No Patient able to express need for assistance with ADLs?: Yes Does the patient have difficulty dressing or bathing?: No Independently performs ADLs?: Yes (appropriate for developmental age) Does the patient have difficulty walking or climbing stairs?: No Weakness of Legs: None Weakness of Arms/Hands: None  Home Assistive Devices/Equipment Home Assistive Devices/Equipment: None  Therapy Consults (therapy consults require a physician order) PT Evaluation Needed: No OT Evalulation Needed: No SLP Evaluation Needed: No Abuse/Neglect Assessment (Assessment to be complete while patient is alone) Physical Abuse: Denies Verbal Abuse: Denies Sexual Abuse: Denies Exploitation of patient/patient's resources: Denies Self-Neglect: Denies Values / Beliefs Cultural Requests During Hospitalization: None Spiritual Requests During Hospitalization: None Consults Spiritual Care Consult Needed: No Social Work Consult Needed: No Merchant navy officer (For Healthcare) Does patient have an advance directive?: No Would patient like information on creating an advanced directive?: No - patient declined information Nutrition Screen- [facility]  Adult/[facility] Patient's home diet: Regular  Additional Information 1:1 In Past 12 Months?: No CIRT Risk:  No Elopement Risk: No Does patient have medical clearance?: Yes     Disposition:  Disposition Initial Assessment Completed for this Encounter: Yes Disposition of Patient: Referred to (AM psych eval for final disposition ) Patient referred to: Other (Comment) (AM psych eval for final disposition )  Murrell Redden 05/07/2014 10:24 PM

## 2014-05-07 NOTE — ED Notes (Signed)
Pt reports she is unable to care for her kids, her daughter is with the father at present.  Pt reports she divorced from husband x 6 yrs ago.  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  Admits to approximately 30 lbs wt loss over 6 yrs, pt states she doesn't cook or care for herself.  Pt reports she is currently living in her brothers rental home, but the brother states if she doesn't get it together he will kick her out.  Admits to SI at present, cooperative but anxious.

## 2014-05-07 NOTE — ED Notes (Signed)
Pt brought in by [employer] under IVC papers that state: Increased depressive symptoms (tearfulness, increased weight loss, lethargy, and loss of interest) and suicidal thoughts.  She has been hospitalized recently at [facility] for SI.  She states, "My life is crap, I'm unable to function, I want to die". She has been prescribed Ambien and Prozac and overdosed on ambien yesterday.  She is unable to contract for safety and is a danger to herself.

## 2014-05-08 ENCOUNTER — Encounter (HOSPITAL_COMMUNITY): Payer: Self-pay | Admitting: Psychiatry

## 2014-05-08 DIAGNOSIS — R45851 Suicidal ideations: Secondary | ICD-10-CM

## 2014-05-08 DIAGNOSIS — F333 Major depressive disorder, recurrent, severe with psychotic symptoms: Secondary | ICD-10-CM

## 2014-05-08 DIAGNOSIS — F332 Major depressive disorder, recurrent severe without psychotic features: Secondary | ICD-10-CM | POA: Diagnosis present

## 2014-05-08 DIAGNOSIS — F411 Generalized anxiety disorder: Secondary | ICD-10-CM

## 2014-05-08 MED ORDER — HYDROXYZINE HCL 25 MG PO TABS
25.0000 mg | ORAL_TABLET | Freq: Four times a day (QID) | ORAL | Status: DC | PRN
  Administered 2014-05-08: 25 mg via ORAL
  Filled 2014-05-08: qty 1

## 2014-05-08 MED ORDER — FLUOXETINE HCL 20 MG PO CAPS
20.0000 mg | ORAL_CAPSULE | Freq: Every day | ORAL | Status: DC
  Administered 2014-05-08: 20 mg via ORAL
  Filled 2014-05-08 (×2): qty 1

## 2014-05-08 MED ORDER — DIPHENHYDRAMINE HCL 25 MG PO CAPS: 50.0000 mg | ORAL_CAPSULE | Freq: Every evening | ORAL | Status: DC | PRN

## 2014-05-08 NOTE — Progress Notes (Signed)
[facility] [employer],  Provided pt with a [employer], highlighting [facility] [place], to help patient establish primary care and outpatient mental health resources.

## 2014-05-08 NOTE — ED Notes (Signed)
Pt interacting appropriately with staff.  Pt stated that she felt like she was being crushed with everything happening to her (loss of spouse, jobs, home and child).  Pt reported she had to let her daughter go live with her husband because she was unable to provide for her.  Pt reported that she finds it difficult to do daily things such as shower, cook and even eat. Pt reported she had been starving herself for months and have lost 30 pounds.  Support and encouragement provided. Pt receptive.  Fifteen minute checks in progress for patient safety.  Pt safe on unit.

## 2014-05-08 NOTE — ED Notes (Signed)
Pt complaints anxiety/insomnia.  Medication administered.  See [facility]

## 2014-05-08 NOTE — ED Notes (Signed)
Pt presented at nurses station. Pt asked if towels and toiletries are provided; given to patient.

## 2014-05-08 NOTE — Consult Note (Signed)
[place] Psychiatry Consult   Reason for Consult:  Depression with suicidal ideations and a plan Referring Physician:  EDP  Heather Reilly is an 48 y.o. female. Total Time spent with patient: 20 minutes  Assessment: AXIS I:  Anxiety Disorder NOS and Major Depression, Recurrent severe AXIS II:  Borderline Personality Dis. AXIS III:   Past Medical History  Diagnosis Date  . Anxiety   . Depression    AXIS IV:  economic problems, housing problems, occupational problems, other psychosocial or environmental problems, problems related to social environment and problems with primary support group AXIS V:  21-30 behavior considerably influenced by delusions or hallucinations OR serious impairment in judgment, communication OR inability to function in almost all areas  Plan:  Recommend psychiatric Inpatient admission when medically cleared.Dr. Darleene Cleaver assessed the patient and concurs with the plan.  Subjective:   Heather Reilly is a 48 y.o. female patient admitted with depression and suicide plan.  HPI:  The patient has had increasing depression over the past few months with loss of job, house, and her son moving to [place] along with having to send her 64 yo daughter to live with her ex-husband since she could not afford to provide for her.  She has lost multiple jobs, 30 pound weight loss since the winter time.  R[place]ham has stated "I want to die." for the past year and a half.  She has abuse alcohol and benzodiazepines in the past but not recently.  Denies hallucinations and homicidal ideations but wants help for her negative, suicidal ideations. HPI Elements:   Location:  generalized. Quality:  acute. Severity:  severe. Timing:  constant. Duration:  months. Context:  stressors.  Past Psychiatric History: Past Medical History  Diagnosis Date  . Anxiety   . Depression     reports that she has quit smoking. Her smoking use included Cigarettes. She has a .25 pack-year smoking history.  She has never used smokeless tobacco. She reports that she drinks alcohol. She reports that she uses illicit drugs (Benzodiazepines). History reviewed. No pertinent family history. Family History Substance Abuse: No Family Supports: Yes, List: (Family/friends) Living Arrangements: Alone Can pt return to current living arrangement?: Yes Abuse/Neglect [facility]) Physical Abuse: Denies Verbal Abuse: Denies Sexual Abuse: Denies Allergies:   Allergies  Allergen Reactions  . Penicillins Hives, Shortness Of Breath and Itching  . Ambien [Zolpidem Tartrate]     Does not make her sleep  . Sulfa Antibiotics Hives and Itching  . Trazodone And Nefazodone     Does not make her sleep    ACT Assessment Complete:  Yes:    Educational Status    Risk to Self: Risk to self with the past 6 months Suicidal Ideation: Yes-Currently Present Suicidal Intent: No-Not Currently/Within Last 6 Months Is patient at risk for suicide?: No Suicidal Plan?: No-Not Currently/Within Last 6 Months Access to Means: Yes Specify Access to Suicidal Means: Sharps, Pills  What has been your use of drugs/alcohol within the last 12 months?: Hx of 3 mon binge on alcohol and pills  Previous Attempts/Gestures: No How many times?: 0 Other Self Harm Risks: None  Triggers for Past Attempts: None known Intentional Self Injurious Behavior: None Family Suicide History: No Recent stressful life event(s): Divorce;Financial Problems;Job Loss Persecutory voices/beliefs?: No Depression: Yes Depression Symptoms: Loss of interest in usual pleasures;Feeling worthless/self pity;Guilt;Fatigue;Isolating;Insomnia Substance abuse history and/or treatment for substance abuse?: Yes Suicide prevention information given to non-admitted patients: Not applicable  Risk to Others: Risk to Others  within the past 6 months Homicidal Ideation: No Thoughts of Harm to Others: No Current Homicidal Intent: No Current Homicidal Plan: No Access to Homicidal  Means: No Identified Victim: None  History of harm to others?: No Assessment of Violence: None Noted Violent Behavior Description: None  Does patient have access to weapons?: No Criminal Charges Pending?: No Does patient have a court date: No  Abuse: Abuse/Neglect Assessment (Assessment to be complete while patient is alone) Physical Abuse: Denies Verbal Abuse: Denies Sexual Abuse: Denies Exploitation of patient/patient's resources: Denies Self-Neglect: Denies  Prior Inpatient Therapy: Prior Inpatient Therapy Prior Inpatient Therapy: Yes Prior Therapy Dates: 2014 Prior Therapy Facilty/Provider(s): [facility]  Reason for Treatment: Depression/SI   Prior Outpatient Therapy: Prior Outpatient Therapy Prior Outpatient Therapy: No Prior Therapy Dates: None  Prior Therapy Facilty/Provider(s): None  Reason for Treatment: None   Additional Information: Additional Information 1:1 In Past 12 Months?: No CIRT Risk: No Elopement Risk: No Does patient have medical clearance?: Yes                  Objective: Blood pressure 94/59, pulse 82, temperature 98.4 F (36.9 C), temperature source Oral, resp. rate 18, last menstrual period 05/14/2013, SpO2 100.00%.There is no weight on file to calculate BMI. Results for orders placed during the hospital encounter of 05/07/14 (from the past 72 hour(s))  URINE RAPID DRUG SCREEN (HOSP PERFORMED)     Status: None   Collection Time    05/07/14  6:46 PM      Result Value Ref Range   Opiates NONE DETECTED  NONE DETECTED   Cocaine NONE DETECTED  NONE DETECTED   Benzodiazepines NONE DETECTED  NONE DETECTED   Amphetamines NONE DETECTED  NONE DETECTED   Tetrahydrocannabinol NONE DETECTED  NONE DETECTED   Barbiturates NONE DETECTED  NONE DETECTED   Comment:            DRUG SCREEN FOR MEDICAL PURPOSES     ONLY.  IF CONFIRMATION IS NEEDED     FOR ANY PURPOSE, NOTIFY LAB     WITHIN 5 DAYS.                LOWEST DETECTABLE LIMITS     FOR URINE DRUG  SCREEN     Drug Class       Cutoff (ng/mL)     Amphetamine      1000     Barbiturate      200     Benzodiazepine   850     Tricyclics       277     Opiates          300     Cocaine          300     THC              50  ACETAMINOPHEN LEVEL     Status: None   Collection Time    05/07/14  6:54 PM      Result Value Ref Range   Acetaminophen (Tylenol), Serum <15.0  10 - 30 ug/mL   Comment:            THERAPEUTIC CONCENTRATIONS VARY     SIGNIFICANTLY. A RANGE OF 10-30     ug/mL MAY BE AN EFFECTIVE     CONCENTRATION FOR MANY PATIENTS.     HOWEVER, SOME ARE BEST TREATED     AT CONCENTRATIONS OUTSIDE THIS     RANGE.     ACETAMINOPHEN CONCENTRATIONS     >  150 ug/mL AT 4 HOURS AFTER     INGESTION AND >50 ug/mL AT 12     HOURS AFTER INGESTION ARE     OFTEN ASSOCIATED WITH TOXIC     REACTIONS.  CBC     Status: None   Collection Time    05/07/14  6:54 PM      Result Value Ref Range   WBC 6.8  4.0 - 10.5 K/uL   RBC 4.84  3.87 - 5.11 MIL/uL   Hemoglobin 14.7  12.0 - 15.0 g/dL   HCT 42.4  36.0 - 46.0 %   MCV 87.6  78.0 - 100.0 fL   MCH 30.4  26.0 - 34.0 pg   MCHC 34.7  30.0 - 36.0 g/dL   RDW 12.0  11.5 - 15.5 %   Platelets 253  150 - 400 K/uL  COMPREHENSIVE METABOLIC PANEL     Status: Abnormal   Collection Time    05/07/14  6:54 PM      Result Value Ref Range   Sodium 139  137 - 147 mEq/L   Potassium 4.0  3.7 - 5.3 mEq/L   Chloride 99  96 - 112 mEq/L   CO2 26  19 - 32 mEq/L   Glucose, Bld 109 (*) 70 - 99 mg/dL   BUN 10  6 - 23 mg/dL   Creatinine, Ser 0.64  0.50 - 1.10 mg/dL   Calcium 9.7  8.4 - 10.5 mg/dL   Total Protein 8.2  6.0 - 8.3 g/dL   Albumin 4.2  3.5 - 5.2 g/dL   AST 22  0 - 37 U/L   ALT 19  0 - 35 U/L   Alkaline Phosphatase 109  39 - 117 U/L   Total Bilirubin 0.4  0.3 - 1.2 mg/dL   GFR calc non Af Amer >90  >90 mL/min   GFR calc Af Amer >90  >90 mL/min   Comment: (NOTE)     The eGFR has been calculated using the CKD EPI equation.     This calculation has not  been validated in all clinical situations.     eGFR's persistently <90 mL/min signify possible Chronic Kidney     Disease.   Anion gap 14  5 - 15  ETHANOL     Status: None   Collection Time    05/07/14  6:54 PM      Result Value Ref Range   Alcohol, Ethyl (B) <11  0 - 11 mg/dL   Comment:            LOWEST DETECTABLE LIMIT FOR     SERUM ALCOHOL IS 11 mg/dL     FOR MEDICAL PURPOSES ONLY  SALICYLATE LEVEL     Status: Abnormal   Collection Time    05/07/14  6:54 PM      Result Value Ref Range   Salicylate Lvl <9.6 (*) 2.8 - 20.0 mg/dL   Labs are reviewed and are pertinent for no medical issues noted.  Current Facility-Administered Medications  Medication Dose Route Frequency Provider Last Rate Last Dose  . alum & mag hydroxide-simeth (MAALOX/MYLANTA) 200-200-20 MG/5ML suspension 30 mL  30 mL Oral PRN Hannah Muthersbaugh, PA-C      . ibuprofen (ADVIL,MOTRIN) tablet 600 mg  600 mg Oral Q8H PRN Hannah Muthersbaugh, PA-C   600 mg at 05/08/14 0910  . LORazepam (ATIVAN) tablet 1 mg  1 mg Oral Q8H PRN Hannah Muthersbaugh, PA-C   1 mg at 05/08/14 0910  . nicotine (  NICODERM CQ - dosed in mg/24 hours) patch 21 mg  21 mg Transdermal Daily Hannah Muthersbaugh, PA-C      . ondansetron (ZOFRAN) tablet 4 mg  4 mg Oral Q8H PRN Hannah Muthersbaugh, PA-C   4 mg at 05/08/14 1212  . zolpidem (AMBIEN) tablet 5 mg  5 mg Oral QHS PRN Abigail Butts, PA-C       Current Outpatient Prescriptions  Medication Sig Dispense Refill  . diphenhydrAMINE (BENADRYL) 25 mg capsule Take 50 mg by mouth at bedtime as needed for sleep.      Marland Kitchen FLUoxetine HCl (PROZAC PO) Take 1 capsule by mouth daily.      Marland Kitchen OVER THE COUNTER MEDICATION Take 2 capsules by mouth at bedtime as needed (sleep (easy sleep aid)).      Marland Kitchen ZOLPIDEM TARTRATE PO Take 1 tablet by mouth at bedtime as needed (sleep.).        Psychiatric Specialty Exam:     Blood pressure 94/59, pulse 82, temperature 98.4 F (36.9 C), temperature source Oral, resp.  rate 18, last menstrual period 05/14/2013, SpO2 100.00%.There is no weight on file to calculate BMI.  General Appearance: Casual  Eye Contact::  Fair  Speech:  Normal Rate  Volume:  Normal  Mood:  Anxious and Depressed  Affect:  Congruent  Thought Process:  Coherent  Orientation:  Full (Time, Place, and Person)  Thought Content:  Rumination  Suicidal Thoughts:  Yes.  with intent/plan  Homicidal Thoughts:  No  Memory:  Immediate;   Fair Recent;   Fair Remote;   Fair  Judgement:  Fair  Insight:  Fair  Psychomotor Activity:  Normal  Concentration:  Fair  Recall:  [employer]  Language: Fair  Akathisia:  No  Handed:  Right  AIMS (if indicated):     Assets:  Communication Skills Housing Leisure Time Physical Health Resilience Social Support  Sleep:      Musculoskeletal: Strength & Muscle Tone: within normal limits Gait & Station: normal Patient leans: N/A  Treatment Plan Summary: Daily contact with patient to assess and evaluate symptoms and progress in treatment Medication management; admit to inpatient psychiatry for stabilization.  Waylan Boga, [employer] 05/08/2014 4:41 PM  Patient seen, evaluated and I agree with notes by Nurse Practitioner. Corena Pilgrim, MD

## 2014-05-09 ENCOUNTER — Inpatient Hospital Stay (HOSPITAL_COMMUNITY)
Admission: AD | Admit: 2014-05-09 | Discharge: 2014-05-18 | DRG: 885 | Disposition: A | Discharge status: Home / Self Care | Payer: BC Managed Care – PPO | Source: Intra-hospital | Attending: Psychiatry | Admitting: Psychiatry

## 2014-05-09 ENCOUNTER — Encounter (HOSPITAL_COMMUNITY): Payer: Self-pay | Admitting: Internal Medicine

## 2014-05-09 DIAGNOSIS — F329 Major depressive disorder, single episode, unspecified: Secondary | ICD-10-CM | POA: Diagnosis present

## 2014-05-09 DIAGNOSIS — F322 Major depressive disorder, single episode, severe without psychotic features: Secondary | ICD-10-CM | POA: Diagnosis present

## 2014-05-09 DIAGNOSIS — Z23 Encounter for immunization: Secondary | ICD-10-CM

## 2014-05-09 DIAGNOSIS — F603 Borderline personality disorder: Secondary | ICD-10-CM | POA: Diagnosis present

## 2014-05-09 DIAGNOSIS — F429 Obsessive-compulsive disorder, unspecified: Secondary | ICD-10-CM

## 2014-05-09 DIAGNOSIS — F411 Generalized anxiety disorder: Secondary | ICD-10-CM | POA: Diagnosis present

## 2014-05-09 DIAGNOSIS — R45851 Suicidal ideations: Secondary | ICD-10-CM | POA: Diagnosis present

## 2014-05-09 DIAGNOSIS — F42 Obsessive-compulsive disorder: Secondary | ICD-10-CM | POA: Diagnosis present

## 2014-05-09 DIAGNOSIS — F418 Other specified anxiety disorders: Secondary | ICD-10-CM | POA: Diagnosis present

## 2014-05-09 DIAGNOSIS — F332 Major depressive disorder, recurrent severe without psychotic features: Principal | ICD-10-CM | POA: Diagnosis present

## 2014-05-09 DIAGNOSIS — G4700 Insomnia, unspecified: Secondary | ICD-10-CM | POA: Diagnosis present

## 2014-05-09 DIAGNOSIS — Z599 Problem related to housing and economic circumstances, unspecified: Secondary | ICD-10-CM

## 2014-05-09 DIAGNOSIS — Z87891 Personal history of nicotine dependence: Secondary | ICD-10-CM

## 2014-05-09 MED ORDER — MAGNESIUM HYDROXIDE 400 MG/5ML PO SUSP
30.0000 mL | Freq: Every day | ORAL | Status: DC | PRN
  Administered 2014-05-11: 30 mL via ORAL

## 2014-05-09 MED ORDER — ALUM & MAG HYDROXIDE-SIMETH 200-200-20 MG/5ML PO SUSP: 30.0000 mL | ORAL | Status: DC | PRN

## 2014-05-09 MED ORDER — INFLUENZA VAC SPLIT QUAD 0.5 ML IM SUSY
0.5000 mL | PREFILLED_SYRINGE | INTRAMUSCULAR | Status: DC
  Filled 2014-05-09: qty 0.5

## 2014-05-09 MED ORDER — ACETAMINOPHEN 325 MG PO TABS: 650.0000 mg | ORAL_TABLET | Freq: Four times a day (QID) | ORAL | Status: DC | PRN

## 2014-05-09 MED ORDER — PNEUMOCOCCAL VAC POLYVALENT 25 MCG/0.5ML IJ INJ: 0.5000 mL | INJECTION | INTRAMUSCULAR | Status: DC

## 2014-05-09 MED ORDER — HYDROXYZINE HCL 25 MG PO TABS
25.0000 mg | ORAL_TABLET | Freq: Once | ORAL | Status: DC
  Filled 2014-05-09 (×3): qty 1

## 2014-05-09 MED ORDER — MIRTAZAPINE 15 MG PO TABS
15.0000 mg | ORAL_TABLET | Freq: Every day | ORAL | Status: DC
  Administered 2014-05-09 – 2014-05-17 (×9): 15 mg via ORAL
  Filled 2014-05-09 (×10): qty 1

## 2014-05-09 MED ORDER — HYDROXYZINE HCL 25 MG PO TABS
25.0000 mg | ORAL_TABLET | Freq: Four times a day (QID) | ORAL | Status: DC | PRN
  Administered 2014-05-09 – 2014-05-17 (×3): 25 mg via ORAL
  Filled 2014-05-09 (×2): qty 1

## 2014-05-09 NOTE — Progress Notes (Signed)
Patient ID: Heather Reilly, female   DOB: [birth year], 48 y.o.   MRN: [id number]   D: Pt has been very flat and depressed on the unit, she was in the bed most of the day. Pt reported that she was not feeling well. Pts BP was running low, staff continued to encourage increase fluid intake. Pt did get up around lunch, and did attempt to go to afternoon group.  Pt reported being negative SI/HI, no AH/VH noted. A: 15 min checks continued for patient safety. R: Pt safety maintained.

## 2014-05-09 NOTE — [facility] Group Notes (Signed)
[facility] Group Notes:  (Nursing/MHT/Case Management/Adjunct)  Date:  05/09/2014  Time:  2:39 PM  Type of Therapy:  Psychoeducational Skills  Participation Level:  Active  Participation Quality:  Appropriate  Affect:  Appropriate  Cognitive:  Appropriate  Insight:  Appropriate  Engagement in Group:  Engaged  Modes of Intervention:  Discussion  Summary of Progress/Problems: Pt did attend healthy coping skills group.  Heather Reilly 05/09/2014, 2:39 PM 

## 2014-05-09 NOTE — Tx Team (Signed)
Initial Interdisciplinary Treatment Plan   PATIENT STRESSORS: Financial difficulties Loss of home and job   PROBLEM LIST: Problem List/Patient Goals Date to be addressed Date deferred Reason deferred Estimated date of resolution  depression 05/09/2014     anxiety 05/09/2014     Risk of suicide 05/09/2014                                          DISCHARGE CRITERIA:  Improved stabilization in mood, thinking, and/or behavior Motivation to continue treatment in a less acute level of care  PRELIMINARY DISCHARGE PLAN: Outpatient therapy Placement in alternative living arrangements  PATIENT/FAMIILY INVOLVEMENT: This treatment plan has been presented to and reviewed with the patient, Heather Reilly, and/or family member,  The patient and family have been given the opportunity to ask questions and make suggestions.  Heather Reilly, [employer] K 05/09/2014, 3:11 AM

## 2014-05-09 NOTE — [facility] Group Notes (Addendum)
[facility] Group Notes:  (Nursing/MHT/Case Management/Adjunct)  Date:  05/09/2014  Time:  2:16 PM  Type of Therapy:  Psychoeducational Skills- Pt self inventory group.   Participation Level:  Did Not Attend    Buford Dresser 05/09/2014, 2:16 PM

## 2014-05-09 NOTE — [facility] Suicide Risk Assessment (Signed)
Suicide Risk Assessment  Admission Assessment     Nursing information obtained from:  Patient Demographic factors:  Divorced or widowed;Caucasian;Living alone Current Mental Status:  Suicidal ideation indicated by patient Loss Factors:  Decrease in vocational status;Financial problems / change in socioeconomic status Historical Factors:  Prior suicide attempts Risk Reduction Factors:  Responsible for children under 50 years of age Total Time spent with patient: 45 minutes  CLINICAL FACTORS:   Depression:   Anhedonia Hopelessness Severe  Psychiatric Specialty Exam:     Blood pressure 98/59, pulse 98, temperature 98.3 F (36.8 C), temperature source Oral, resp. rate 18, weight 50 kg (110 lb 3.7 oz), last menstrual period 05/14/2013.Body mass index is 20.16 kg/(m^2).  General Appearance: Disheveled  Eye Solicitor::  Fair  Speech:  Clear and Coherent  Volume:  fluctuates  Mood:  Anxious, Depressed, Dysphoric, Hopeless and worried  Affect:  Labile  Thought Process:  Coherent and Goal Directed  Orientation:  Full (Time, Place, and Person)  Thought Content:  events sympotms worries concerns  Suicidal Thoughts:  Yes.  without intent/plan  Homicidal Thoughts:  No  Memory:  Immediate;   Fair Recent;   Fair Remote;   Fair  Judgement:  Fair  Insight:  Present  Psychomotor Activity:  Restlessness  Concentration:  Fair  Recall:  [employer] of Knowledge:NA  Language: Fair  Akathisia:  No  Handed:    AIMS (if indicated):     Assets:  Desire for Improvement  Sleep:  Number of Hours: 3   Musculoskeletal: Strength & Muscle Tone: within normal limits Gait & Station: normal Patient leans: N/A  COGNITIVE FEATURES THAT CONTRIBUTE TO RISK:  Closed-mindedness Loss of executive function Polarized thinking Thought constriction (tunnel vision)    SUICIDE RISK:   Moderate:  Frequent suicidal ideation with limited intensity, and duration, some specificity in terms of plans, no associated  intent, good self-control, limited dysphoria/symptomatology, some risk factors present, and identifiable protective factors, including available and accessible social support.  PLAN OF CARE: Supportive approach/coping skills/relapse prevention                               CBT: mindfulness                               Reassess and address the co morbidities                               Try Remeron for sleep                               Consider going back to Prozac   I certify that inpatient services furnished can reasonably be expected to improve the patient's condition.  Heather Reilly A 05/09/2014, 4:41 PM

## 2014-05-09 NOTE — Progress Notes (Signed)
Patient ID: Heather Reilly, female   DOB: 01-20-1966, 48 y.o.   MRN: [id number] Admission note: D:Patient is a Involuntary admission in no acute distress for increase depression and suicidal thoughts. Pt recently lost job, home and her daughter moved in with her father because mother is not able to care for her. Pt reportly overdose on 6 Ambien to help her sleep and attempt OD.Pt son is also moving to [place]. Pt reports grieving the lost of her marriage six years ago. Pt has a history of alcohol and benzo abuse.   A: Pt admitted to unit per protocol, skin assessment and belonging search done. No skin issues noted. Consent signed by pt. Pt educated on therapeutic milieu rules. Pt was introduced to milieu by nursing staff. Fall risk safety plan explained to the patient. 15 minutes checks started for safety.  R: Pt was receptive to education. Writer offered support.

## 2014-05-09 NOTE — Progress Notes (Signed)
Spoke with patient 1:1. Patient complaining of tightness in stomach that she attributes to anxiety. Asking for prn to help ease her nerves. States, "you don't know what I'm dealing with. You wouldn't even believe it. Like, crises on a daily basis. I'm not crazy. I'm just having a hard time." PA phoned and order received for vistaril prn which was given without difficulty along with scheduled remeron. Patient very concerned she will be unable to sleep as she has yet to find a sleep aid that is effective. Med education provided on remeron and encouraged patient to give feedback to MD tomorrow regarding effectiveness. Patient verbalized understanding. She denies SI/HI and remains safe. Lawrence Marseilles

## 2014-05-09 NOTE — Plan of Care (Signed)
Problem: Ineffective individual coping Goal: STG: Patient will remain free from self harm Outcome: Progressing Patient has not engaged in any self harm and denies SI at present.   Problem: Diagnosis: Increased Risk For Suicide Attempt Goal: STG-Patient Will Comply With Medication Regime Outcome: Progressing Patient has been med compliant.
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
## 2014-05-09 NOTE — Progress Notes (Signed)
Pt hypotensive this am. Pt encouraged to increase fluid intake. Writer provided pt with Gatorade this am. Pt took two sips and went back to sleep. Pt noncompliant with treatment at this time. Pt appears withdrawn and isolative.

## 2014-05-09 NOTE — Progress Notes (Signed)
Attended group 
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
## 2014-05-09 NOTE — ED Notes (Signed)
Pt accepted for admission to [facility].  Report called to [facility].  Pending [facility] transport.

## 2014-05-09 NOTE — [facility] Group Notes (Addendum)
[facility] Group Notes: (Clinical Social Work)   05/09/2014      Type of Therapy:  Group Therapy   Participation Level:  Did Not Attend - was in group at the beginning, but left   Ambrose Mantle, LCSW 05/09/2014, 12:03 PM

## 2014-05-09 NOTE — Progress Notes (Signed)
Report received from M.Neurosurgeon. Writer entered patient's room and observed her lying in bed talking with her roommate. She voiced no complaints and reports that she was waiting for her sleep aid to take effect. Safety maintained on unit with 15 min checks.

## 2014-05-10 DIAGNOSIS — F411 Generalized anxiety disorder: Secondary | ICD-10-CM

## 2014-05-10 MED ORDER — FLUOXETINE HCL 20 MG PO CAPS
20.0000 mg | ORAL_CAPSULE | Freq: Every day | ORAL | Status: DC
  Administered 2014-05-11: 20 mg via ORAL
  Filled 2014-05-10 (×5): qty 1

## 2014-05-10 NOTE — [facility] Group Notes (Signed)
[facility] Group Notes:  (Nursing/MHT/Case Management/Adjunct)  Date:  05/10/2014  Time:  4:24 PM  Type of Therapy:  Psychoeducational Skills- Healthy Support Systems.   Participation Level:  Did Not Attend   Jacquelyne Balint [facility] 05/10/2014, 4:24 PM

## 2014-05-10 NOTE — Progress Notes (Signed)
Patient did attend the evening speaker AA meeting.  

## 2014-05-10 NOTE — Progress Notes (Signed)
D. Pt has been in room and isolative much of the day today, very minimal interaction or participation. Pt appears sad and depressed and withdrawn while in the milieu this evening. Pt spoke briefly about having difficulties in life but did not elaborate much further. A. Support and encouragement provided. R. Safety maintained, will continue to monitor.

## 2014-05-10 NOTE — Progress Notes (Signed)
Reportt received from B. McNichols RN. Writer entered patients room and observed her and her roommate lying in  bed laughing and talking with each other. Patient voiced no complaints or expressed no needs at this time. Safety maintained on unit with 15 min checks.

## 2014-05-10 NOTE — [facility] Group Notes (Signed)
[facility] Group Notes:  (Clinical Social Work)  05/10/2014  10:00-11:00AM  Summary of Progress/Problems:   The main focus of today's process group was to   1)  discuss the importance of adding supports  2)  define health supports versus unhealthy supports  3)  identify the patient's current unhealthy supports and plan how to handle them  4)  Identify the patient's current healthy supports and plan what to add.  An emphasis was placed on using counselor, doctor, therapy groups, 12-step groups, and problem-specific support groups to expand supports.    The patient expressed full comprehension of the concepts presented, and agreed that there is a need to add more supports.  The patient stated that one brother and one sister constitute her current support system, and she is not sure yet what she will add.  She curled up into the chair and appeared drowsy and withdrawn for the remainder of group, did not speak further.  Type of Therapy:  Process Group with Motivational Interviewing  Participation Level:  Minimal  Participation Quality:  Drowsy  Affect:  Depressed and Flat  Cognitive:  Oriented  Insight:  Limited  Engagement in Therapy:  Limited  Modes of Intervention:   Education, Support and Processing, Activity  [employer], LCSW 05/10/2014, 12:15pm

## 2014-05-10 NOTE — [facility] Group Notes (Signed)
[facility] Group Notes:  (Nursing/MHT/Case Management/Adjunct)  Date:  05/10/2014  Time:  4:24 PM  Type of Therapy:  Psychoeducational Skills- Pt Self Inventory Group.   Participation Level:  Did Not Attend    Heather Reilly [facility] 05/10/2014, 4:24 PM

## 2014-05-10 NOTE — Progress Notes (Signed)
[facility] MD Progress Note  05/10/2014 3:28 PM Heather Reilly  MRN:  [id number] Subjective:  Heather Reilly states she is having a hard time. States she is afraid nothing can be done to help her. She sees everything as "circumstantial " States she lost her job because she got completely unable to function. She is losing her house as cant pay for it as she is not working. States she cant reach out to family for help. She states her sister is going to call the bank to see if anything can be done about the foreclosure but does not see her doing anything else. She admits she is ashamed of what her life has become. She states " I don't used to look like this, is not me." she also describes on going OCD symptoms. States it runs in her family. This also gets on the way of her getting better. Diagnosis:   DSM5: Obsessive-Compulsive Disorders:  OCD Depressive Disorders:  Major Depressive Disorder - Severe (296.23) Total Time spent with patient: 30 minutes  Axis I: Generalized Anxiety Disorder  ADL's:  Intact  Sleep: Poor  Appetite:  Fair Psychiatric Specialty Exam: Physical Exam  Review of Systems  Constitutional: Negative.   HENT: Negative.   Eyes: Negative.   Respiratory: Negative.   Cardiovascular: Negative.   Gastrointestinal: Negative.   Genitourinary: Negative.   Musculoskeletal: Negative.   Skin: Negative.   Neurological: Negative.   Endo/Heme/Allergies: Negative.   Psychiatric/Behavioral: Positive for depression. The patient is nervous/anxious and has insomnia.     Blood pressure 95/38, pulse 87, temperature 97.6 F (36.4 C), temperature source Oral, resp. rate 16, weight 50 kg (110 lb 3.7 oz), last menstrual period 05/14/2013.Body mass index is 20.16 kg/(m^2).  General Appearance: Disheveled  Eye Solicitor::  Fair  Speech:  Clear and Coherent  Volume:  fluctuates  Mood:  Anxious and Depressed  Affect:  anxious, worried  Thought Process:  Coherent and Goal Directed  Orientation:  Full (Time,  Place, and Person)  Thought Content:  symptoms worries concerns a sense of hopelessness helplessness  Suicidal Thoughts:  No  Homicidal Thoughts:  No  Memory:  Immediate;   Fair Recent;   Fair Remote;   Fair  Judgement:  Fair  Insight:  Present and Shallow  Psychomotor Activity:  Restlessness  Concentration:  Fair  Recall:  [employer] of Knowledge:NA  Language: Fair  Akathisia:  No  Handed:    AIMS (if indicated):     Assets:  Desire for Improvement  Sleep:  Number of Hours: 3   Musculoskeletal: Strength & Muscle Tone: within normal limits Gait & Station: normal Patient leans: N/A  Current Medications: Current Facility-Administered Medications  Medication Dose Route Frequency Provider Last Rate Last Dose  . acetaminophen (TYLENOL) tablet 650 mg  650 mg Oral Q6H PRN Court Joy, PA-C      . alum & mag hydroxide-simeth (MAALOX/MYLANTA) 200-200-20 MG/5ML suspension 30 mL  30 mL Oral Q4H PRN Court Joy, PA-C      . FLUoxetine (PROZAC) capsule 20 mg  20 mg Oral Daily Rachael Fee, MD      . hydrOXYzine (ATARAX/VISTARIL) tablet 25 mg  25 mg Oral Once Court Joy, PA-C      . hydrOXYzine (ATARAX/VISTARIL) tablet 25 mg  25 mg Oral Q6H PRN Court Joy, PA-C   25 mg at 05/09/14 2230  . Influenza vac split quadrivalent PF (FLUARIX) injection 0.5 mL  0.5 mL Intramuscular Tomorrow-1000 Nehemiah Massed, MD      .  magnesium hydroxide (MILK OF MAGNESIA) suspension 30 mL  30 mL Oral Daily PRN Court Joy, PA-C      . mirtazapine (REMERON) tablet 15 mg  15 mg Oral QHS Rachael Fee, MD   15 mg at 05/09/14 2245  . pneumococcal 23 valent vaccine (PNU-IMMUNE) injection 0.5 mL  0.5 mL Intramuscular Tomorrow-1000 Nehemiah Massed, MD        Lab Results: No results found for this or any previous visit (from the past 48 hour(s)).  Physical Findings: AIMS: Facial and Oral Movements Muscles of Facial Expression: None, normal Lips and Perioral Area: None, normal Jaw: None,  normal Tongue: None, normal,Extremity Movements Upper (arms, wrists, hands, fingers): None, normal Lower (legs, knees, ankles, toes): None, normal, Trunk Movements Neck, shoulders, hips: None, normal, Overall Severity Severity of abnormal movements (highest score from questions above): None, normal Incapacitation due to abnormal movements: None, normal Patient's awareness of abnormal movements (rate only patient's report): No Awareness, Dental Status Current problems with teeth and/or dentures?: No Does patient usually wear dentures?: No  CIWA:    COWS:     Treatment Plan Summary: Daily contact with patient to assess and evaluate symptoms and progress in treatment Medication management  Plan: Supportive approach/coping skills           Will start the Prozac 20 mg daily, states she has taken it before            Will pursue the Remeron further           CBT;mindfulness challenge the cognitive errors Medical Decision Making Problem Points:  Review of psycho-social stressors (1) Data Points:  Review of medication regiment & side effects (2) Review of new medications or change in dosage (2)  I certify that inpatient services furnished can reasonably be expected to improve the patient's condition.   Jasiel Apachito A 05/10/2014, 3:28 PM

## 2014-05-11 DIAGNOSIS — F429 Obsessive-compulsive disorder, unspecified: Secondary | ICD-10-CM

## 2014-05-11 MED ORDER — ENSURE COMPLETE PO LIQD
237.0000 mL | Freq: Three times a day (TID) | ORAL | Status: DC
  Administered 2014-05-11 – 2014-05-16 (×5): 237 mL via ORAL

## 2014-05-11 MED ORDER — FLUOXETINE HCL 20 MG PO CAPS
30.0000 mg | ORAL_CAPSULE | Freq: Every day | ORAL | Status: DC
  Administered 2014-05-12 – 2014-05-14 (×3): 30 mg via ORAL
  Filled 2014-05-11 (×5): qty 1

## 2014-05-11 NOTE — Progress Notes (Signed)
[facility] MD Progress Note  05/11/2014 2:41 PM RANDAL YEPIZ  MRN:  [id number] Subjective:  Fynlee continues to have a hard time. She minimizes her symptoms asserting that this is all "circumstantial." Yet she has not been able to get herself together in the last several months. She has gotten to be immobilized unable to get up, take care of herself, fix meals ( this seems to be secondary to a depression with the OCD symptoms). She continues to go back and ruminate about  the way things used to be before, his previous functioning, the way things were when she was still married) Cant get herself move past this. She admits she sometimes feels "she would rather be dead, as she is of no use to anyone" Diagnosis:   DSM5: Depressive Disorders:  Major Depressive Disorder - Severe (296.23) Total Time spent with patient: 30 minutes  Axis I: Obsessive Compulsive Disorder  ADL's:  Intact  Sleep: Poor  Appetite:  Fair Psychiatric Specialty Exam: Physical Exam  Review of Systems  Constitutional: Negative.   HENT: Negative.   Eyes: Negative.   Respiratory: Negative.   Gastrointestinal: Negative.   Genitourinary: Negative.   Musculoskeletal: Negative.   Skin: Negative.   Neurological: Negative.   Endo/Heme/Allergies: Negative.   Psychiatric/Behavioral: Positive for depression and substance abuse. The patient is nervous/anxious.     Blood pressure 104/68, pulse 67, temperature 97.7 F (36.5 C), temperature source Oral, resp. rate 18, weight 50 kg (110 lb 3.7 oz), last menstrual period 05/14/2013.Body mass index is 20.16 kg/(m^2).  General Appearance: Fairly Groomed  Patent attorney::  Fair  Speech:  Clear and Coherent  Volume:  Normal  Mood:  Anxious, Depressed, Hopeless and Worthless  Affect:  depressed, anxious, worried  Thought Process:  Coherent and Goal Directed  Orientation:  Full (Time, Place, and Person)  Thought Content:  Rumination and events symptoms worries concerns  Suicidal Thoughts:  No   Homicidal Thoughts:  No  Memory:  Immediate;   Fair Recent;   Fair Remote;   Fair  Judgement:  Fair  Insight:  Present  Psychomotor Activity:  Restlessness  Concentration:  Fair  Recall:  [employer] of Knowledge:NA  Language: Fair  Akathisia:  No  Handed:    AIMS (if indicated):     Assets:  Desire for Improvement  Sleep:  Number of Hours: 4   Musculoskeletal: Strength & Muscle Tone: within normal limits Gait & Station: normal Patient leans: N/A  Current Medications: Current Facility-Administered Medications  Medication Dose Route Frequency Provider Last Rate Last Dose  . acetaminophen (TYLENOL) tablet 650 mg  650 mg Oral Q6H PRN Court Joy, PA-C      . alum & mag hydroxide-simeth (MAALOX/MYLANTA) 200-200-20 MG/5ML suspension 30 mL  30 mL Oral Q4H PRN Court Joy, PA-C      . feeding supplement (ENSURE COMPLETE) (ENSURE COMPLETE) liquid 237 mL  237 mL Oral TID BM Tenny Craw, RD      . FLUoxetine (PROZAC) capsule 20 mg  20 mg Oral Daily Rachael Fee, MD   20 mg at 05/11/14 4034  . hydrOXYzine (ATARAX/VISTARIL) tablet 25 mg  25 mg Oral Once Court Joy, PA-C      . hydrOXYzine (ATARAX/VISTARIL) tablet 25 mg  25 mg Oral Q6H PRN Court Joy, PA-C   25 mg at 05/09/14 2230  . Influenza vac split quadrivalent PF (FLUARIX) injection 0.5 mL  0.5 mL Intramuscular Tomorrow-1000 Nehemiah Massed, MD      .  magnesium hydroxide (MILK OF MAGNESIA) suspension 30 mL  30 mL Oral Daily PRN Court Joy, PA-C      . mirtazapine (REMERON) tablet 15 mg  15 mg Oral QHS Rachael Fee, MD   15 mg at 05/10/14 2249  . pneumococcal 23 valent vaccine (PNU-IMMUNE) injection 0.5 mL  0.5 mL Intramuscular Tomorrow-1000 Nehemiah Massed, MD        Lab Results: No results found for this or any previous visit (from the past 48 hour(s)).  Physical Findings: AIMS: Facial and Oral Movements Muscles of Facial Expression: None, normal Lips and Perioral Area: None, normal Jaw: None,  normal Tongue: None, normal,Extremity Movements Upper (arms, wrists, hands, fingers): None, normal Lower (legs, knees, ankles, toes): None, normal, Trunk Movements Neck, shoulders, hips: None, normal, Overall Severity Severity of abnormal movements (highest score from questions above): None, normal Incapacitation due to abnormal movements: None, normal Patient's awareness of abnormal movements (rate only patient's report): No Awareness, Dental Status Current problems with teeth and/or dentures?: No Does patient usually wear dentures?: No  CIWA:    COWS:     Treatment Plan Summary: Daily contact with patient to assess and evaluate symptoms and progress in treatment Medication management  Plan: Supportive approach/coping skills           CBT;mindfulness           Reassess and optimize response to psychotropics  Medical Decision Making Problem Points:  Review of psycho-social stressors (1) Data Points:  Review of medication regiment & side effects (2)  I certify that inpatient services furnished can reasonably be expected to improve the patient's condition.   Khyre Germond A 05/11/2014, 2:41 PM

## 2014-05-11 NOTE — Progress Notes (Signed)
D: Pt presents with flat affect and depressed mood. Pt rates depression 10/10 and stated that her depression is circumstantial. Pt stated that she have a lot of problems outside of the hospital. Pt forwards little information and has minimal interaction on the milieu. Pt denies SI/HI/AVH. Pt compliant with taking meds and no adverse reaction to meds verbalized by pt.  A: Medications administered as ordered per MD. Verbal support given. Pt encouraged to attend groups. 15 minute checks performed for safety.  R: Pt safety maintained at this time

## 2014-05-11 NOTE — [facility] Group Notes (Signed)
[facility] LCSW Group Therapy          Overcoming Obstacles       1:15 -2:30        05/11/2014       Type of Therapy:  Group Therapy  Participation Level:  Appropriate  Participation Quality:  Appropriate  Affect: Depressed, Tearful  Cognitive:  Attentive Appropriate  Insight: Developing/Improving Engaged  Engagement in Therapy: Developing/Imprvoing Engaged  Modes of Intervention:  Discussion Exploration  Education Rapport BuildingProblem-Solving Support  Summary of Progress/Problems:  The main focus of today's group was overcoming obstacles.  She shared the obstacle she has to overcome is lack of employment and support.  Patient stated she has lost everything including her identify and does not know how to move forward.  Patient stated she is at the place of needing someone to hold her hand to move forward. She shared she has family but they are not supportive. Patient encouraged to take advantage of support from outside resources including [facility].   Wynn Banker 05/11/2014

## 2014-05-11 NOTE — Progress Notes (Signed)
NUTRITION ASSESSMENT  Pt identified as at risk on the Malnutrition Screen Tool  INTERVENTION: 1. Educated patient on the importance of nutrition and encouraged intake of food and beverages. 2. Recommend outpatient RD follow up 3. Supplements: Ensure Complete TID  NUTRITION DIAGNOSIS: Unintentional weight loss related to sub-optimal intake as evidenced by pt report.   Goal: Pt to meet >/= 90% of their estimated nutrition needs.  Monitor:  PO intake  Assessment:  Patient reports increasing depression, with decreased motivation since 2008 when her husband "just left me". Worsening symptoms as she lost her job, her son moved to [place] and she lost her house. Her 33 yo daughter now lives with her ex-husband and she only sees her on the week-ends" She repeats "I just want to die, there is no way out, nobody can help me".   - Met with pt who reports not eating anything at all x 1 year with 35 pound unintended weight loss in the past year. Weight trend shows pt's weight down only 9 pounds in the past year.  - Reports she was giving herself lots of excuses at home as to why she wasn't eating such as she couldn't afford groceries or she couldn't cook in her kitchen - Said she used to [employer] but now she can't, and she doesn't know why - Reports staying hungry "all the time" and is worried if she goes home she will "starve to death" and states she's getting help from Dr. Sabra Heck about this - Said she's been eating 50% of her meals and got double portions for lunch yesterday but also stated "nothing tastes good" and not liking the snacks - Agreeable to getting Ensure Complete   48 y.o. female  Height: Ht Readings from Last 1 Encounters:  03/14/14 '5\' 2"'  (1.575 m)    Weight: Wt Readings from Last 1 Encounters:  05/09/14 110 lb 3.7 oz (50 kg)    Weight Hx: Wt Readings from Last 10 Encounters:  05/09/14 110 lb 3.7 oz (50 kg)  03/14/14 112 lb (50.803 kg)  07/29/13 119 lb 8 oz (54.205 kg)   11/15/12 132 lb (59.875 kg)    BMI:  Body mass index is 20.16 kg/(m^2). Pt meets criteria for normal weight based on current BMI.  Estimated Nutritional Needs: Kcal: 25-30 kcal/kg Protein: > 1 gram protein/kg Fluid: 1 ml/kcal  Diet Order: General Pt is also offered choice of unit snacks mid-morning and mid-afternoon.  Pt is eating as desired.   Lab results and medications reviewed. Getting Remeron, was started 9/26.   Carlis Stable MS, [place], LDN [phone] Pager [phone] Weekend/After Hours Pager

## 2014-05-11 NOTE — [facility] Group Notes (Signed)
[facility] LCSW Aftercare Discharge Planning Group Note   05/11/2014 12:47 PM    Participation Quality:  Appropraite  Mood/Affect:  Appropriate  Depression Rating:  5  Anxiety Rating:  5  Thoughts of Suicide:  Yes (patient is passively suicidal)  Will you contract for safety?   Yes  Current AVH:  No  Plan for Discharge/Comments:  Patient attended discharge planning group and actively participated in group.  She denies any active SI but states it would be okay with her if she did not wake up in the morning. She advised of being out of work for a while, breakup of her marriage and son relocating to [place].  Patient is not being followed for outpatient services.  CSW provided all participants with daily workbook.   Transportation Means: Patient has transportation.   Supports:  Patient has a support system.   Laresa Oshiro, Joesph July

## 2014-05-11 NOTE — Progress Notes (Signed)
Adult Psychoeducational Group Note  Date:  05/11/2014 Time:  8:15pm Group Topic/Focus:  Wrap-Up Group:   The focus of this group is to help patients review their daily goal of treatment and discuss progress on daily workbooks.  Participation Level:  Active  Participation Quality:  Appropriate and Attentive  Affect:  Appropriate  Cognitive:  Alert and Appropriate  Insight: Appropriate  Engagement in Group:  Engaged  Modes of Intervention:  Discussion  Additional Comments:  Pt. Was attentive and appropriate during today's group discussion. Pt shared that she is currently working with family and Child psychotherapist on trying to find a plan once she is discharged. Pt stated that she know she needs help and tired of going home to the same mess.   Bing Plume D 05/11/2014, 9:58 PM

## 2014-05-12 MED ORDER — DOCUSATE SODIUM 100 MG PO CAPS
100.0000 mg | ORAL_CAPSULE | Freq: Every day | ORAL | Status: DC
  Administered 2014-05-12 – 2014-05-18 (×7): 100 mg via ORAL
  Filled 2014-05-12 (×10): qty 1

## 2014-05-12 NOTE — Progress Notes (Signed)
D: Pt presents flat in affect and depressed and anxious in mood. Pt goes into detail about the factors that brought upon her inpatient hospitalization. Pt denies being suicidal, but states that she is going to die anyway. Pt verbally contracts for safety. Pt is hopeless in believing that she would be able to return home and approprietly cook and feed herself as her body requires. She says that she could if someone was there to instruct and encourage her to do so. Pt reports that her stool is hard in consistency. She says that it took her about a hour to use the bathroom. She reports that she had some active bleeding from hemorrhoids. Pt was given milk of mg. Writer will follow-up with the on-call extender in adding a stool softener to her daily medication regimen.  A: Writer administered scheduled and prn medications to pt. Continued support and availability as needed was extended to this pt. Staff continue to monitor pt with q6515min checks.  R: No adverse drug reactions noted. Pt receptive to treatment. Pt remains safe at this time.

## 2014-05-12 NOTE — [facility] Group Notes (Signed)
[facility] LCSW Group Therapy      Feelings About Diagnosis 1:15 - 2:30 PM         05/12/2014    Type of Therapy:  Group Therapy  Participation Level:  Active  Participation Quality:  Appropriate  Affect:  Appropriate  Cognitive:  Alert and Appropriate  Insight:  Developing/Improving and Engaged  Engagement in Therapy:  Developing/Improving and Engaged  Modes of Intervention:  Discussion, Education, Exploration, Problem-Solving, Rapport Building, Support  Summary of Progress/Problems:  Patient actively participated in group. Patient discussed past and present diagnosis and the effects it has had on  life.  Patient talked about family and society being judgmental and the stigma associated with having a mental health diagnosis.  Patient shared she is okay with her diagnosis and understands it is something else in her life that has to be fixed.  She shared she knows it can happen but struggles with where to start.  Wynn BankerHodnett, Shevaun Lovan Hairston 05/12/2014

## 2014-05-12 NOTE — Progress Notes (Signed)
D: Pt presents with flat affect and depressed mood. Pt has minimal interaction on the milieu. Pt has poor insight for treatment and continues to think negatively about the [facility]. Pt stated that she is depressed due to her circumstances at home. Pt stated that she has not been able to find a job, living with her brother and lacks motivation to care for herself.  A: Medications administered as ordered per MD. Verbal support given. Pt encouraged to attend groups. 15 minute checks performed for safety.  R: Pt safety maintained at this time.

## 2014-05-12 NOTE — Progress Notes (Signed)
Recreation Therapy Notes  Animal-Assisted Activity/Therapy (AAA/T) Program Checklist/Progress Notes Patient Eligibility Criteria Checklist & Daily Group note for Rec Tx Intervention  Date: 09.29.2015 Time: 2:45pm Location: [address] Dayroom   AAA/T Program Assumption of Risk Form signed by Patient/ or Parent Legal Guardian yes  Patient is free of allergies or sever asthma yes  Patient reports no fear of animals yes  Patient reports no history of cruelty to animals yes   Patient understands his/her participation is voluntary yes  Patient washes hands before animal contact yes  Patient washes hands after animal contact yes  Behavioral Response: Observation  Education: Hand Washing, Appropriate Animal Interaction   Education Outcome: Acknowledges education.   Clinical Observations/Feedback: Patient maintained roll of observed during pet therapy session, stating that she "doesn't really like dogs." Patient asked appropriate questions and shared stories about a dog she had as a child with group.   Marykay Lexenise L Omar Gayden, LRT/CTRS  Teara Duerksen L 05/12/2014 4:06 PM

## 2014-05-12 NOTE — ED Provider Notes (Signed)
Medical screening examination/treatment/procedure(s) were performed by non-physician practitioner and as supervising physician I was immediately available for consultation/collaboration.   EKG Interpretation None        Audree CamelScott T Helen Cuff, MD 05/12/14 2313

## 2014-05-12 NOTE — Tx Team (Signed)
Interdisciplinary Treatment Plan Update   Date Reviewed:  05/12/2014  Time Reviewed:  12:08 PM  Progress in Treatment:   Attending groups: Yes Participating in groups: Yes Taking medication as prescribed: Yes  Tolerating medication: Yes Family/Significant other contact made:  No, but consent given for collateral contact with sister Patient understands diagnosis: Yes  Discussing patient identified problems/goals with staff: Yes, patient able to address her goals for treatment Medical problems stabilized or resolved: Yes Denies suicidal/homicidal ideation: Yes Patient has not harmed self or others: Yes  For review of initial/current patient goals, please see plan of care.  Estimated Length of Stay:  2-3 days  Reasons for Continued Hospitalization:  Anxiety Depression Medication stabilization  New Problems/Goals identified:    Discharge Plan or Barriers:   Home with [facility]  Additional Comments:   MD working on medication stabilization  Patient and CSW reviewed patient's identified goals and treatment plan.  Patient verbalized understanding and agreed to treatment plan.   Attendees:  Patient:  05/12/2014 12:08 PM   Signature:  Sallyanne HaversF. Cobos, MD 05/12/2014 12:08 PM  Signature: Geoffery LyonsIrving Lugo, MD 05/12/2014 12:08 PM  Signature:  [place] Minersonna Shimp,  RN 05/12/2014 12:08 PM  Signature:  Liborio NixonPatrice White, RN 05/12/2014 12:08 PM  Signature:   05/12/2014 12:08 PM  Signature:  Juline PatchQuylle Derrian Rodak, LCSW 05/12/2014 12:08 PM  Signature: Trula SladeHeather Smart, LCSW-A 05/12/2014 12:08 PM  Signature:  Leisa LenzValerie Enoch, Care Coordinator [facility] 05/12/2014 12:08 PM  Signature:   05/12/2014 12:08 PM  Signature: 05/12/2014  12:08 PM  Signature:   Onnie BoerJennifer Clark, RN URCM 05/12/2014  12:08 PM  Signature:   05/12/2014  12:08 PM    Scribe for Treatment Team:   Juline PatchQuylle Daren Doswell,  05/12/2014 12:08 PM

## 2014-05-12 NOTE — Progress Notes (Addendum)
[facility]  05/12/2014 5:07 PM Heather Reilly  MRN:  [id number] Subjective:  Heather Reilly is having a very hard time. She admits she feels hopeless, helpless. She states she is feeling she is not going to be able to make it. "Migh as well give up now." States she can see herself staring at the stove not knowing how to proceed, how to put things together to fix a meal. States she feels she needs to have someone besides her trying to show her what to do, how to do it. States she has lost her desire to eat, she enjoys nothing Diagnosis:   DSM5: Obsessive Compulsive Disorder Depressive Disorders:  Major Depressive Disorder - Severe (296.23) Total Time spent with patient: 30 minutes  Axis I: Generalized Anxiety Disorder  ADL's:  Intact  Sleep: Poor  Appetite:  Poor Psychiatric Specialty Exam: Physical Exam  Review of Systems  Constitutional: Positive for malaise/fatigue.  HENT: Negative.   Eyes: Negative.   Respiratory: Negative.   Cardiovascular: Negative.   Gastrointestinal: Negative.   Genitourinary: Negative.   Musculoskeletal: Negative.   Skin: Negative.   Neurological: Positive for weakness.  Endo/Heme/Allergies: Negative.   Psychiatric/Behavioral: Positive for depression. The patient is nervous/anxious and has insomnia.     Blood pressure 97/53, pulse 64, temperature 97.8 F (36.6 C), temperature source Oral, resp. rate 16, weight 50 kg (110 lb 3.7 oz), last menstrual period 05/14/2013.Body mass index is 20.16 kg/(m^2).  General Appearance: Fairly Groomed  Patent attorneyye Contact::  Minimal  Speech:  Clear and Coherent, Slow and not spontaneous  Volume:  Decreased  Mood:  Anxious and Depressed  Affect:  Restricted  Thought Process:  Coherent and Goal Directed  Orientation:  Full (Time, Place, and Person)  Thought Content:  symptoms worries concerns  Suicidal Thoughts:  Passive SI  Homicidal Thoughts:  No  Memory:  Immediate;   Fair Recent;   Fair Remote;   Fair  Judgement:   Fair  Insight:  Shallow, very superfical  Psychomotor Activity:  Restlessness  Concentration:  Fair  Recall:  [employer]Fair  Fund of Knowledge:NA  Language: Fair  Akathisia:  No  Handed:    AIMS (if indicated):     Assets:  Desire for Improvement  Sleep:  Number of Hours: 4.25   Musculoskeletal: Strength & Muscle Tone: within normal limits Gait & Station: normal Patient leans: N/A  Current Medications: Current Facility-Administered Medications  Medication Dose Route Frequency Provider Last Rate Last Dose  . acetaminophen (TYLENOL) tablet 650 mg  650 mg Oral Q6H PRN Court Joyharles E Kober, PA-C      . alum & mag hydroxide-simeth (MAALOX/MYLANTA) 200-200-20 MG/5ML suspension 30 mL  30 mL Oral Q4H PRN Court Joyharles E Kober, PA-C      . docusate sodium (COLACE) capsule 100 mg  100 mg Oral Daily Kerry HoughSpencer E Simon, PA-C   100 mg at 05/12/14 1014  . feeding supplement (ENSURE COMPLETE) (ENSURE COMPLETE) liquid 237 mL  237 mL Oral TID BM Tenny CrawHeather S Winkler, RD   237 mL at 05/11/14 2205  . FLUoxetine (PROZAC) capsule 30 mg  30 mg Oral Daily Rachael FeeIrving A Gia Lusher, MD   30 mg at 05/12/14 1014  . hydrOXYzine (ATARAX/VISTARIL) tablet 25 mg  25 mg Oral Once Court Joyharles E Kober, PA-C      . hydrOXYzine (ATARAX/VISTARIL) tablet 25 mg  25 mg Oral Q6H PRN Court Joyharles E Kober, PA-C   25 mg at 05/09/14 2230  . Influenza vac split quadrivalent PF (FLUARIX) injection 0.5  mL  0.5 mL Intramuscular Tomorrow-1000 Nehemiah Massed, MD      . magnesium hydroxide (MILK OF MAGNESIA) suspension 30 mL  30 mL Oral Daily PRN Court Joy, PA-C   30 mL at 05/11/14 2205  . mirtazapine (REMERON) tablet 15 mg  15 mg Oral QHS Rachael Fee, MD   15 mg at 05/11/14 2331  . pneumococcal 23 valent vaccine (PNU-IMMUNE) injection 0.5 mL  0.5 mL Intramuscular Tomorrow-1000 Nehemiah Massed, MD        Lab Results: No results found for this or any previous visit (from the past 48 hour(s)).  Physical Findings: AIMS: Facial and Oral Movements Muscles of Facial  Expression: None, normal Lips and Perioral Area: None, normal Jaw: None, normal Tongue: None, normal,Extremity Movements Upper (arms, wrists, hands, fingers): None, normal Lower (legs, knees, ankles, toes): None, normal, Trunk Movements Neck, shoulders, hips: None, normal, Overall Severity Severity of abnormal movements (highest score from questions above): None, normal Incapacitation due to abnormal movements: None, normal Patient's awareness of abnormal movements (rate only patient's report): No Awareness, Dental Status Current problems with teeth and/or dentures?: No Does patient usually wear dentures?: No  CIWA:    COWS:     Treatment Plan Summary: Daily contact with patient to assess and evaluate symptoms and progress in treatment Medication management  Plan: Supportive approach/coping skills           Will pursue the Prozac at 30 mg           Will continue the Remeron 15 mg HS           Will work with CBT: challenge the cognitive errors ( catastrophic thinking, filtering                     personalizing)             Medical Decision Making Problem Points:  Review of psycho-social stressors (1) Data Points:  Review of medication regiment & side effects (2)  I certify that inpatient services furnished can reasonably be expected to improve the patient's condition.   Heather Reilly A 05/12/2014, 5:07 PM

## 2014-05-12 NOTE — [facility] Group Notes (Signed)
Adult Psychoeducational Group Note  Date:  05/12/2014 Time:  10:45 PM  Group Topic/Focus:  AA Meeting  Participation Level:  Minimal  Participation Quality:  Appropriate  Affect:  Flat  Cognitive:  Appropriate  Insight: Good  Engagement in Group:  Limited  Modes of Intervention:  Discussion and Education  Additional Comments:  Heather Reilly was attentive and explained why she was here and how drugs and alcohol have affected her family.  Caroll RancherLindsay, Angelis Gates A 05/12/2014, 10:45 PM

## 2014-05-12 NOTE — [facility] Suicide Risk Assessment (Signed)
[facility] INPATIENT:  Family/Significant Other Suicide Prevention Education  Suicide Prevention Education:  Education Completed; Lenda KelpBernadette Price, Sister, [phone]; has been identified by the patient as the family member/significant other with whom the patient will be residing, and identified as the person(s) who will aid the patient in the event of a mental health crisis (suicidal ideations/suicide attempt).  With written consent from the patient, the family member/significant other has been provided the following suicide prevention education, prior to the and/or following the discharge of the patient.  The suicide prevention education provided includes the following:  Suicide risk factors  Suicide prevention and interventions  National Suicide Hotline telephone number  [facility] assessment telephone number  [facility] Emergency Assistance 911  [facility] and/or Residential Mobile Crisis Unit telephone number  Request made of family/significant other to:  Remove weapons (e.g., guns, rifles, knives), all items previously/currently identified as safety concern.   Sister advised patient does not have access to weapons.  Remove drugs/medications (over-the-counter, prescriptions, illicit drugs), all items previously/currently identified as a safety concern.  The family member/significant other verbalizes understanding of the suicide prevention education information provided.  The family member/significant other agrees to remove the items of safety concern listed above.  Wynn BankerHodnett, Abhi Moccia Hairston 05/12/2014, 12:19 PM

## 2014-05-13 MED ORDER — FLUOXETINE HCL 10 MG PO CAPS
ORAL_CAPSULE | ORAL | Status: AC
  Filled 2014-05-13: qty 1

## 2014-05-13 MED ORDER — FLUOXETINE HCL 20 MG PO CAPS
ORAL_CAPSULE | ORAL | Status: AC
  Filled 2014-05-13: qty 1

## 2014-05-13 NOTE — Progress Notes (Addendum)
Pt observed in the dayroom watching TV and talking with peers.  Pt reports she feels the same emotionally.  She says her depression is still high and cannot see how it can get better.  She reports she had a bad day, but does not specify why it was bad.  She is still c/o hard stools and feels the MOM did not help.  Pt was started on Colace, but still no relief.    Pt denies SI/HI/AV.  Pt makes her needs known to staff.  Discharge plans still in process.  Support and encouragement offered.  Safety maintained with q15 minute checks.

## 2014-05-13 NOTE — Progress Notes (Signed)
Heather Reilly  05/13/2014 2:31 PM Heather Reilly  MRN:  [id number] Subjective:  Heather Reilly continues to endorse that she feels hopeless helpless. States she is not getting support from anywhere. "I am going to die, I know I am." states she cant see herself as being able to move on. She continues to contemplate the image of her being in front of the stove not sure of what to do or how to do it. States she feels she needs to have someone by her side telling her what to do Diagnosis:   DSM5: Obsessive-Compulsive Disorders:  OCD Depressive Disorders:  Major Depressive Disorder - Severe (296.23) Total Time spent with patient: 30 minutes  Axis I: Generalized Anxiety Disorder  ADL's:  Intact  Sleep: Fair  Appetite:  Fair Psychiatric Specialty Exam: Physical Exam  Review of Systems  Constitutional: Positive for malaise/fatigue.  HENT: Negative.   Eyes: Negative.   Respiratory: Negative.   Cardiovascular: Negative.   Gastrointestinal: Negative.   Genitourinary: Negative.   Musculoskeletal: Negative.   Skin: Negative.   Neurological: Positive for weakness.  Endo/Heme/Allergies: Negative.   Psychiatric/Behavioral: Positive for depression. The patient is nervous/anxious.     Blood pressure 103/78, pulse 79, temperature 97.9 F (36.6 C), temperature source Oral, resp. rate 16, weight 50 kg (110 lb 3.7 oz), last menstrual period 05/14/2013.Body mass index is 20.16 kg/(m^2).  General Appearance: Fairly Groomed  Patent attorneyye Contact::  Fair  Speech:  Clear and Coherent, Slow and not spontaneous  Volume:  fluctuates  Mood:  Anxious, Depressed, Hopeless and Worthless  Affect:  sad, anxious, worried, at one time tearful  Thought Process:  Coherent and Goal Directed  Orientation:  Full (Time, Place, and Person)  Thought Content:  ruminative thinking worries concerns issues of self esteem self efficacy a sense of hopelessness helplesness  Suicidal Thoughts:  " I want to die " "I am going to die"   Homicidal Thoughts:  No  Memory:  Immediate;   Fair Recent;   Fair Remote;   Fair  Judgement:  Fair  Insight:  Present and Shallow  Psychomotor Activity:  Restlessness  Concentration:  Fair  Recall:  [employer]Fair  Fund of Knowledge:NA  Language: Fair  Akathisia:  No  Handed:    AIMS (if indicated):     Assets:  Desire for Improvement  Sleep:  Number of Hours: 6.5   Musculoskeletal: Strength & Muscle Tone: within normal limits Gait & Station: normal Patient leans: N/A  Current Medications: Current Facility-Administered Medications  Medication Dose Route Frequency Provider Last Rate Last Dose  . acetaminophen (TYLENOL) tablet 650 mg  650 mg Oral Q6H PRN Court Joyharles E Kober, PA-C      . alum & mag hydroxide-simeth (MAALOX/MYLANTA) 200-200-20 MG/5ML suspension 30 mL  30 mL Oral Q4H PRN Court Joyharles E Kober, PA-C      . docusate sodium (COLACE) capsule 100 mg  100 mg Oral Daily Kerry HoughSpencer E Simon, PA-C   100 mg at 05/13/14 1158  . feeding supplement (ENSURE COMPLETE) (ENSURE COMPLETE) liquid 237 mL  237 mL Oral TID BM Tenny CrawHeather S Winkler, RD   237 mL at 05/11/14 2205  . FLUoxetine (PROZAC) 10 MG capsule           . FLUoxetine (PROZAC) 20 MG capsule           . FLUoxetine (PROZAC) capsule 30 mg  30 mg Oral Daily Rachael FeeIrving A Jovee Dettinger, MD   30 mg at 05/13/14 1157  . hydrOXYzine (ATARAX/VISTARIL) tablet 25  mg  25 mg Oral Once Court Joy, PA-C      . hydrOXYzine (ATARAX/VISTARIL) tablet 25 mg  25 mg Oral Q6H PRN Court Joy, PA-C   25 mg at 05/09/14 2230  . Influenza vac split quadrivalent PF (FLUARIX) injection 0.5 mL  0.5 mL Intramuscular Tomorrow-1000 Nehemiah Massed, MD      . magnesium hydroxide (MILK OF MAGNESIA) suspension 30 mL  30 mL Oral Daily PRN Court Joy, PA-C   30 mL at 05/11/14 2205  . mirtazapine (REMERON) tablet 15 mg  15 mg Oral QHS Rachael Fee, MD   15 mg at 05/12/14 2155  . pneumococcal 23 valent vaccine (PNU-IMMUNE) injection 0.5 mL  0.5 mL Intramuscular Tomorrow-1000 Nehemiah Massed, MD        Lab Results: No results found for this or any previous visit (from the past 48 hour(s)).  Physical Findings: AIMS: Facial and Oral Movements Muscles of Facial Expression: None, normal Lips and Perioral Area: None, normal Jaw: None, normal Tongue: None, normal,Extremity Movements Upper (arms, wrists, hands, fingers): None, normal Lower (legs, knees, ankles, toes): None, normal, Trunk Movements Neck, shoulders, hips: None, normal, Overall Severity Severity of abnormal movements (highest score from questions above): None, normal Incapacitation due to abnormal movements: None, normal Patient's awareness of abnormal movements (rate only patient's report): No Awareness, Dental Status Current problems with teeth and/or dentures?: No Does patient usually wear dentures?: No  CIWA:    COWS:     Treatment Plan Summary: Daily contact with patient to assess and evaluate symptoms and progress in treatment Medication management  Plan: Supportive approach/coping skills           CBT;mindfulness           Will increase the Prozac to 40 mg daily   Medical Decision Making Problem Points:  Review of psycho-social stressors (1) Data Points:  Review of medication regiment & side effects (2) Review of new medications or change in dosage (2)  I certify that inpatient services furnished can reasonably be expected to improve the patient's condition.   Jahmad Petrich A 05/13/2014, 2:31 PM

## 2014-05-13 NOTE — [facility] Group Notes (Signed)
[facility] LCSW Group Therapy  Emotional Regulation 1:15 - 2: 30 PM        05/13/2014  3:15 PM    Type of Therapy:  Group Therapy  Participation Level:  Minimal  Participation Quality:  Appropriate  Affect:  Appropriate  Cognitive:  Attentive Appropriate  Insight:  Developing/Improving   Engagement in Therapy:  Developing/Improving   Modes of Intervention:  Discussion Exploration Problem-Solving Supportive  Summary of Progress/Problems:  Group topic was emotional regulations.  Patient stated she was having a difficult time and not able to participate in the discussion.  Wynn BankerHodnett, Stella Encarnacion Hairston 05/13/2014 3:15 PM

## 2014-05-13 NOTE — Progress Notes (Signed)
Attended group 

## 2014-05-13 NOTE — Progress Notes (Signed)
D: Pt presents depressed. Pt rates depression 10/10. Pt denies SI/HI. Pt lacks motivation for treatment and has poor insight. Pt presents disheveled this morning. Pt has minimal interaction on the unit. Pt did not take morning meds as scheduled.  A: Medications administered as ordered per MD. Verbal support given. Pt encouraged to attend groups. 15 minute checks performed for safety.  R: Pt safety maintained. Pt contracts for safety.

## 2014-05-13 NOTE — [facility] Group Notes (Signed)
[facility]   05/13/2014 12:32 PM    Participation Quality:  Appropraite  Mood/Affect:  Appropriate  Depression Rating:  8  Anxiety Rating:  1  Thoughts of Suicide:  No  Will you contract for safety?   NA  Current AVH:  No  Plan for Discharge/Comments:  Patient attended discharge planning group and actively participated in group.  Patient advised of being the same as when she admitted to the hospital. [facility]  Referral to be made to Vocational Rehabilitation per patient request. CSW provided all participants with daily workbook.   Transportation Means: Patient has transportation.   Supports:  Patient has a support system.   Theo Krumholz, Joesph JulyQuylle Hairston

## 2014-05-14 DIAGNOSIS — F411 Generalized anxiety disorder: Secondary | ICD-10-CM

## 2014-05-14 MED ORDER — FLUOXETINE HCL 10 MG PO CAPS
10.0000 mg | ORAL_CAPSULE | Freq: Every day | ORAL | Status: DC
  Administered 2014-05-14: 10 mg via ORAL
  Filled 2014-05-14 (×3): qty 1

## 2014-05-14 MED ORDER — FLUOXETINE HCL 20 MG PO CAPS
40.0000 mg | ORAL_CAPSULE | Freq: Every day | ORAL | Status: DC
  Administered 2014-05-15 – 2014-05-18 (×4): 40 mg via ORAL
  Filled 2014-05-14: qty 28
  Filled 2014-05-14 (×5): qty 2

## 2014-05-14 MED ORDER — LORAZEPAM 1 MG PO TABS
1.0000 mg | ORAL_TABLET | Freq: Once | ORAL | Status: AC
  Administered 2014-05-14: 1 mg via ORAL
  Filled 2014-05-14: qty 1

## 2014-05-14 NOTE — Plan of Care (Signed)
Problem: Alteration in mood Goal: STG-Patient reports thoughts of self-harm to staff Outcome: Progressing Pt is in a room with a roommate.  She is not having suicidal thoughts, but says she believes she will die soon.

## 2014-05-14 NOTE — Progress Notes (Signed)
[facility] MD Progress Note  05/14/2014 3:57 PM MATTI KILLINGSWORTH  MRN:  [id number] Subjective:  Heather Reilly continues to try to figure out what is happening and where to go from here. States she has a hard time visualizing what could be her first step moving forward, and fin-ds herself as totally immobilized. "Is that OCD?" she ruminates about her situation, ruminates about all the things she has done wrong. She cant find a purpose for why to go on. She states that the house she is staying at is making things worst. States she thought coming to [place] would make all this support be available to her, but it has not worked out. States she cant get in the shower as feels the walls closing on her, so she gets out not able to take a shower. States she is having a lot of anxiety today in the pit of her stomach Diagnosis:   DSM5: Obsessive-Compulsive Disorders:  OCD Depressive Disorders:  Major Depressive Disorder - Severe (296.23) Total Time spent with patient: 30 minutes  Axis I: Generalized Anxiety Disorder and Obsessive Compulsive Disorder  ADL's:  Intact  Sleep: Fair  Appetite:  Fair Psychiatric Specialty Exam: Physical Exam  Review of Systems  Constitutional: Positive for malaise/fatigue.  HENT: Negative.   Eyes: Negative.   Respiratory: Negative.   Cardiovascular: Negative.   Gastrointestinal: Negative.   Genitourinary: Negative.   Musculoskeletal: Negative.   Skin: Negative.   Neurological: Negative.   Endo/Heme/Allergies: Negative.   Psychiatric/Behavioral: Positive for depression. The patient is nervous/anxious.     Blood pressure 113/57, pulse 95, temperature 98.3 F (36.8 C), temperature source Oral, resp. rate 18, weight 50 kg (110 lb 3.7 oz), last menstrual period 05/14/2013.Body mass index is 20.16 kg/(m^2).  General Appearance: Fairly Groomed  Patent attorney::  Fair  Speech:  Clear and Coherent  Volume:  fluctuates  Mood:  Anxious, Depressed and worried  Affect:  anxious worried   Thought Process:  Circumstantial and Coherent  Orientation:  Full (Time, Place, and Person)  Thought Content:  events worries concerns  Suicidal Thoughts:  No  Homicidal Thoughts:  No  Memory:  Immediate;   Fair Recent;   Fair Remote;   Fair  Judgement:  Fair  Insight:  Shallow  Psychomotor Activity:  Restlessness  Concentration:  Fair  Recall:  [employer] of Knowledge:NA  Language: Fair  Akathisia:  No  Handed:    AIMS (if indicated):     Assets:  Desire for Improvement Housing  Sleep:  Number of Hours: 6.5   Musculoskeletal: Strength & Muscle Tone: within normal limits Gait & Station: normal Patient leans: N/A  Current Medications: Current Facility-Administered Medications  Medication Dose Route Frequency Provider Last Rate Last Dose  . acetaminophen (TYLENOL) tablet 650 mg  650 mg Oral Q6H PRN Court Joy, PA-C      . alum & mag hydroxide-simeth (MAALOX/MYLANTA) 200-200-20 MG/5ML suspension 30 mL  30 mL Oral Q4H PRN Court Joy, PA-C      . docusate sodium (COLACE) capsule 100 mg  100 mg Oral Daily Kerry Hough, PA-C   100 mg at 05/14/14 7829  . feeding supplement (ENSURE COMPLETE) (ENSURE COMPLETE) liquid 237 mL  237 mL Oral TID BM Tenny Craw, RD   237 mL at 05/13/14 1948  . [START ON 05/15/2014] FLUoxetine (PROZAC) capsule 40 mg  40 mg Oral Daily Rachael Fee, MD      . hydrOXYzine (ATARAX/VISTARIL) tablet 25 mg  25 mg  Oral Once Court Joyharles E Kober, PA-C      . hydrOXYzine (ATARAX/VISTARIL) tablet 25 mg  25 mg Oral Q6H PRN Court Joyharles E Kober, PA-C   25 mg at 05/09/14 2230  . Influenza vac split quadrivalent PF (FLUARIX) injection 0.5 mL  0.5 mL Intramuscular Tomorrow-1000 Nehemiah MassedFernando Cobos, MD      . magnesium hydroxide (MILK OF MAGNESIA) suspension 30 mL  30 mL Oral Daily PRN Court Joyharles E Kober, PA-C   30 mL at 05/11/14 2205  . mirtazapine (REMERON) tablet 15 mg  15 mg Oral QHS Rachael FeeIrving A Kaylana Fenstermacher, MD   15 mg at 05/13/14 2237  . pneumococcal 23 valent vaccine  (PNU-IMMUNE) injection 0.5 mL  0.5 mL Intramuscular Tomorrow-1000 Nehemiah MassedFernando Cobos, MD        Lab Results: No results found for this or any previous visit (from the past 48 hour(s)).  Physical Findings: AIMS: Facial and Oral Movements Muscles of Facial Expression: None, normal Lips and Perioral Area: None, normal Jaw: None, normal Tongue: None, normal,Extremity Movements Upper (arms, wrists, hands, fingers): None, normal Lower (legs, knees, ankles, toes): None, normal, Trunk Movements Neck, shoulders, hips: None, normal, Overall Severity Severity of abnormal movements (highest score from questions above): None, normal Incapacitation due to abnormal movements: None, normal Patient's awareness of abnormal movements (rate only patient's report): No Awareness, Dental Status Current problems with teeth and/or dentures?: No Does patient usually wear dentures?: No  CIWA:  CIWA-Ar Total: 1 COWS:  COWS Total Score: 2  Treatment Plan Summary: Daily contact with patient to assess and evaluate symptoms and progress in treatment Medication management  Plan: Supportive approach/coping skills           CBT; address the cognitive distortions : catastrophic thinking among others            Increase the Prozac to 40 mg in AM Medical Decision Making Problem Points:  Review of psycho-social stressors (1) Data Points:  Review of medication regiment & side effects (2)  I certify that inpatient services furnished can reasonably be expected to improve the patient's condition.   Lum Stillinger A 05/14/2014, 3:57 PM

## 2014-05-14 NOTE — Progress Notes (Signed)
[facility] Group Notes:  (Nursing/MHT/Case Management/Adjunct)  Date:  05/14/2014  Time: 2100  Type of Therapy:  wrap up group  Participation Level:  Active  Participation Quality:  Appropriate, Attentive, Sharing and Supportive  Affect:  Appropriate  Cognitive:  Appropriate  Insight:  Improving  Engagement in Group:  Engaged  Modes of Intervention:  Clarification, Education and Support  Summary of Progress/Problems:Pt shared that Dr. Dub MikesLugo has given her hope that she can get a handle on her depression, OCD, and ADHD. She also shared that she wants to work on her confidence.   Shelah LewandowskySquires, Heather Reilly Carol 05/14/2014, 11:44 PM

## 2014-05-14 NOTE — Progress Notes (Signed)
The focus of this group is to educate the patient on the purpose and policies of crisis stabilization and provide a format to answer questions about their admission.  The group details unit policies and expectations of patients while admitted.  Patient did not attend 0900 nurse education orientation group this morning.  Patient stayed in bed.   

## 2014-05-14 NOTE — Clinical Social Work Note (Addendum)
CSW met with patient to determine what she feels she would need to be ready for discharge.  Patient advised she needs a job.  She was informed we are unable to obtain a job but can make the referral to vocational rehab as earlier discussed.  Patient shared she will be living alone in her brother's house.  Patient stated she is not afraid of leaving alone but does not like being alone.  When asked what she would need to be comfortable at discharge, she stated she would want her 60 year old daughter with her and to have a man in her life.  Patient was stated she has not put closure to her divorce.  Patient encourage to take back her power and to realize she can go on with her life without ex-husband.  Patient stated she is able to complete application for scholarship to attend [employer].  She stated she has no friends or social life.  She was encouraged to take advantage of services offered by [facility] in an effort to provide a support system.  Patient encourage to set on goal per day or week, as she is able, and to work to achieve that goal.  CSW contacted V[facility] at [phone] and was advised patient would need to call them and they would get the information they need over the phone from her.  Patient given contact information.

## 2014-05-14 NOTE — Progress Notes (Signed)
Pt reports she does not feel any different than when she was admitted.  Pt states she has not been able to sleep, which is one of her on-going issues.  She says her depression and anxiety levels are still high.  Pt also reports that she continues to have difficulty focusing and concentrating.  Pt denies SI/HI/AV, but says she feels that she will die soon.  Pt is pleasant/cooperative with staff.  She is med compliant.  She still complains of hard stool when she has a bowel movement.  Pt was encouraged to request PeriColace in place of the Colace.  Pt also encouraged to drink plenty of fluids.  Support and encouragement offered.  Pt makes her needs known to staff.  Safety maintained with q15 minute checks.

## 2014-05-14 NOTE — Progress Notes (Addendum)
D:  Patient denied SI and HI, contracts for safety.   Denied A/V hallucinations.  Patient sleeps fair, sleep medication helpful.  Good appetite.  Good concentration.  Rated depression 8, hopeless 8, anxiety 6.  Denied withdrawals.  Patient was experienced agitation, nausea, knots in stomach in past 24 hours.  Worst pain #6.  Goal is to figure out how to function [facility].  Does have discharge plans.  No problems with medications after discharge. A:  Medications administered per MD orders.  Emotional support and encouragement given patient. R:  Safety maintained with 15 minute checks.

## 2014-05-14 NOTE — [facility] Group Notes (Signed)
[facility] LCSW Group Therapy  [facility] 1:15 - 2:30 PM  05/14/2014   Type of Therapy:  Group Therapy  Participation Level: Minimal  Participation Quality:  Attentive  Affect: Flat, Depressed  Cognitive:  Appropriate  Insight:  Developing/Improving   Engagement in Therapy:  Developing/Improving   Modes of Intervention:  Discussion, Education, Exploration, Problem-Solving, Rapport Building, Support   Summary of Progress/Problems:   Patient was attentive to speaker from the [employer] [facility] [employer] as he shared his story of dealing with mental health/substance abuse issues and overcoming it by working a recovery program.  Patient did not express any interest in the presentation.   Heather Reilly, Heather Reilly 05/14/2014

## 2014-05-15 NOTE — Progress Notes (Signed)
D. Pt has been up and has been visible in milieu this evening, has attended and participated in various activities. Pt did endorse anxiety this evening as well as depression and does appear depressed in the milieu. Pt did receive medication without incident. A. Support and encouragement provided. R. Safety maintained, will continue to monitor.

## 2014-05-15 NOTE — [facility] Group Notes (Signed)
[facility]   05/15/2014 10:33 AM    Participation Quality:  Appropraite  Mood/Affect:  Appropriate  Depression Rating:    Anxiety Rating:    Thoughts of Suicide:  No  Will you contract for safety?   NA  Current AVH:  No  Plan for Discharge/Comments:  Patient attended discharge planning group and actively participated in group.  Patient reports being the same.  She will discharge to her home and [facility]  Transportation Means: Patient has transportation.   Supports:  Patient has a support system.   Salvator Seppala, Joesph JulyQuylle Hairston

## 2014-05-15 NOTE — [facility] Group Notes (Signed)
[facility] LCSW Group Therapy  Feelings Around Relapse 1:15 -2:30        05/15/2014   Type of Therapy:  Group Therapy  Participation Level:  Appropriate  Participation Quality:  Appropriate  Affect:  Appropriate  Cognitive:  Attentive Appropriate  Insight:  Developing/Improving  Engagement in Therapy: Developing/Improving  Modes of Intervention:  Discussion Exploration Problem-Solving Supportive  Summary of Progress/Problems:  The topic for today was feelings around relapse.    Patient processed feelings toward relapse and was able to relate to peers. She advised relapsing for her would be returning home and doing nothing but sitting there.  Patient identified coping skills that can be used to prevent a relapse including reaching out to family to spend time with her.Heather Reilly.   Heather Reilly 05/15/2014

## 2014-05-15 NOTE — Tx Team (Signed)
Interdisciplinary Treatment Plan Update   Date Reviewed:  05/15/2014  Time Reviewed:  8:42 AM  Progress in Treatment:   Attending groups: Yes Participating in groups: Yes Taking medication as prescribed: Yes  Tolerating medication: Yes Family/Significant other contact made:  Yes, collateral contact with sister Patient understands diagnosis: Yes  Discussing patient identified problems/goals with staff: Yes, patient able to address her goals for treatment Medical problems stabilized or resolved: Yes Denies suicidal/homicidal ideation: Yes Patient has not harmed self or others: Yes  For review of initial/current patient goals, please see plan of care.  Estimated Length of Stay: 3-4  days  Reasons for Continued Hospitalization:  Anxiety Depression Medication stabilization  New Problems/Goals identified:    Discharge Plan or Barriers:   Home with outpatient [facility]  Additional Comments:   MD to continue medication stabilization  Patient and CSW reviewed patient's identified goals and treatment plan.  Patient verbalized understanding and agreed to treatment plan.   Attendees:  Patient:  05/15/2014 8:42 AM   Signature:  Sallyanne HaversF. Cobos, MD 05/15/2014 8:42 AM  Signature: Geoffery LyonsIrving Lugo, MD 05/15/2014 8:42 AM  Signature:  Harold Barbanonecia Byrd, RN 05/15/2014 8:42 AM  Signature:  Skipper ClichePatti Duke, RN 05/15/2014 8:42 AM  Signature:  Leighton ParodyBritney Tyson, RN 05/15/2014 8:42 AM  Signature:  Juline PatchQuylle Calise Dunckel, LCSW 05/15/2014 8:42 AM  Signature: Belenda CruiseKristin Drinkard, LCSW-A 05/15/2014 8:42 AM  Signature:  Leisa LenzValerie Enoch, Care Coordinator [facility] 05/15/2014 8:42 AM  Signature:   Jon Gillselora Sutton, [facility] Transition Team 05/15/2014 8:42 AM  Signature: 05/15/2014  8:42 AM  Signature:   Onnie BoerJennifer Clark, RN [facility] 05/15/2014  8:42 AM  Signature:   05/15/2014  8:42 AM    Scribe for Treatment Team:   Juline PatchQuylle Stellarose Cerny,  05/15/2014 8:42 AM

## 2014-05-15 NOTE — Progress Notes (Signed)
D: Pt's mood is depressed. Attended group tonight and has been interacting well within the milieu. A: Support given. Verbalization encouraged. Medications given as prescribed. Encouraged pt to come to staff with any concerns.  R: Pt is receptive. No complaints of pain or discomfort at this time. Q15 min safety checks maintained. Pt remains safe on the unit. Will continue to monitor pt.

## 2014-05-15 NOTE — Progress Notes (Signed)
[facility] Group Notes:  (Nursing/MHT/Case Management/Adjunct)  Date:  05/15/2014  Time:  10:58 PM  Type of Therapy:  AA  Participation Level:  Minimal  Participation Quality:  Appropriate  Affect:  Appropriate  Cognitive:  Appropriate  Insight:  Appropriate  Engagement in Group:  Engaged  Modes of Intervention:  Education  Summary of Progress/Problems: Pt. Attended AA.  Sondra ComeWilson, Lyah Millirons J 05/15/2014, 10:58 PM

## 2014-05-15 NOTE — Progress Notes (Addendum)
Heather Reilly remains acutely sad and depressed. SHe slept late this moning , did not want to attend groups and sad " I'm tired of hearing all those stories from everybody...". A She is compliant with her meds and she contracts for safety...saying " I won't hurt myself " when asked by this nurse. She has not completed her morning assessment, although she has been asked to do so several times by this nurse. R Safety remains in place.

## 2014-05-15 NOTE — Progress Notes (Signed)
[facility] :  Will you be returning to the same living situation after discharge: Yes,  Patient will be returning to her home. At discharge, do you have transportation home?:Yes,  Patient will arrange for family to transport her home. Do you have the ability to pay for your medications:No.  Patient will be assisted with indigent medications.   Release of information consent forms completed and in the chart;  Patient's signature needed at discharge.  Patient to Follow up at: Follow-up Information   Follow up with [facility]  On 05/20/2014. (Please go to [facility]'s walk in clinic on Wednesday, May 20, 2014 or any weekday between 8AM - 3PM for medication management)    Contact information:   [address] [place], [place]   [address]    [phone]      Follow up with [facility] On 05/19/2014. (You are scheduled with [facility] on Tuesday, May 19, 2014 at [facility])    Contact information:   [address] [place], [place]   [address]  [phone]      Patient denies SI/HI:  Patient no longer endorsing SI/HI or other thoughts of self harm.  Safety Planning and Suicide Prevention discussed: .Reviewed with all patients during discharge planning group   Kayode Petion, Joesph JulyQuylle Hairston 05/15/2014, 3:08 PM

## 2014-05-15 NOTE — Progress Notes (Signed)
[facility]  05/15/2014 4:19 PM Heather Carwinnne B Reilly  MRN:  [id number] Subjective:  "nothing is going to change, might as well go home" states she is going back to her brother's house and do the same thing she has been doing. Does not see herself as able to move forward. She states she was told she  is the one who needs to call the bank and advocate for herself She does not know if she can do it.  She has no furniture left at her house as she moved most everything to her brother's house. So going back to her house is not an option. Admits that her ex-husband has moved on, and that her daughter is doing OK . She feels "stuck" and sees no one willing to help her move on.  Note: it was communicated to staff by patients sister that she  told her she was looking at ways she could  kill herself once she gets out Diagnosis:   DSM5: Depressive Disorders:  Major Depressive Disorder - Severe (296.23) Total Time spent with patient: 30 minutes  Axis I: Obsessive Compulsive Disorder  ADL's:  Intact  Sleep: Fair  Appetite:  Poor   Psychiatric Specialty Exam: Physical Exam  Review of Systems  Constitutional: Positive for malaise/fatigue.  HENT: Negative.   Eyes: Negative.   Respiratory: Negative.   Cardiovascular: Negative.   Gastrointestinal: Negative.   Genitourinary: Negative.   Musculoskeletal: Negative.   Skin: Negative.   Neurological: Negative.   Endo/Heme/Allergies: Negative.   Psychiatric/Behavioral: Positive for depression. The patient is nervous/anxious and has insomnia.     Blood pressure 86/60, pulse 97, temperature 98.2 F (36.8 C), temperature source Oral, resp. rate 16, weight 50 kg (110 lb 3.7 oz), last menstrual period 05/14/2013.Body mass index is 20.16 kg/(m^2).  General Appearance: Fairly Groomed  Patent attorneyye Contact::  Minimal  Speech:  Clear and Coherent, Slow and not spontaneous  Volume:  Decreased  Mood:  Depressed  Affect:  Restricted  Thought Process:  Coherent and  Goal Directed  Orientation:  Full (Time, Place, and Person)  Thought Content:  events, symptoms  Suicidal Thoughts:  No  Homicidal Thoughts:  No  Memory:  Immediate;   Fair Recent;   Fair Remote;   Fair  Judgement:  Fair  Insight:  Present  Psychomotor Activity:  Restlessness  Concentration:  Fair  Recall:  FiservFair  Fund of Knowledge:NA  Language: Fair  Akathisia:  No  Handed:    AIMS (if indicated):     Assets:  Desire for Improvement Housing  Sleep:  Number of Hours: 5.25   Musculoskeletal: Strength & Muscle Tone: within normal limits Gait & Station: normal Patient leans: N/A  Current Medications: Current Facility-Administered Medications  Medication Dose Route Frequency Provider Last Rate Last Dose  . acetaminophen (TYLENOL) tablet 650 mg  650 mg Oral Q6H PRN Court Joyharles E Kober, PA-C      . alum & mag hydroxide-simeth (MAALOX/MYLANTA) 200-200-20 MG/5ML suspension 30 mL  30 mL Oral Q4H PRN Court Joyharles E Kober, PA-C      . docusate sodium (COLACE) capsule 100 mg  100 mg Oral Daily Kerry HoughSpencer E Simon, PA-C   100 mg at 05/15/14 0847  . feeding supplement (ENSURE COMPLETE) (ENSURE COMPLETE) liquid 237 mL  237 mL Oral TID BM Tenny CrawHeather S Winkler, RD   237 mL at 05/13/14 1948  . FLUoxetine (PROZAC) capsule 40 mg  40 mg Oral Daily Rachael FeeIrving A Nashali Ditmer, MD   40 mg at 05/15/14  1610  . hydrOXYzine (ATARAX/VISTARIL) tablet 25 mg  25 mg Oral Once Court Joy, PA-C      . hydrOXYzine (ATARAX/VISTARIL) tablet 25 mg  25 mg Oral Q6H PRN Court Joy, PA-C   25 mg at 05/09/14 2230  . Influenza vac split quadrivalent PF (FLUARIX) injection 0.5 mL  0.5 mL Intramuscular Tomorrow-1000 Nehemiah Massed, MD      . magnesium hydroxide (MILK OF MAGNESIA) suspension 30 mL  30 mL Oral Daily PRN Court Joy, PA-C   30 mL at 05/11/14 2205  . mirtazapine (REMERON) tablet 15 mg  15 mg Oral QHS Rachael Fee, MD   15 mg at 05/14/14 2300  . pneumococcal 23 valent vaccine (PNU-IMMUNE) injection 0.5 mL  0.5 mL  Intramuscular Tomorrow-1000 Nehemiah Massed, MD        Lab Results: No results found for this or any previous visit (from the past 48 hour(s)).  Physical Findings: AIMS: Facial and Oral Movements Muscles of Facial Expression: None, normal Lips and Perioral Area: None, normal Jaw: None, normal Tongue: None, normal,Extremity Movements Upper (arms, wrists, hands, fingers): None, normal Lower (legs, knees, ankles, toes): None, normal, Trunk Movements Neck, shoulders, hips: None, normal, Overall Severity Severity of abnormal movements (highest score from questions above): None, normal Incapacitation due to abnormal movements: None, normal Patient's awareness of abnormal movements (rate only patient's report): No Awareness, Dental Status Current problems with teeth and/or dentures?: No Does patient usually wear dentures?: No  CIWA:  CIWA-Ar Total: 1 COWS:  COWS Total Score: 2  Treatment Plan Summary: Daily contact with patient to assess and evaluate symptoms and progress in treatment Medication management  Plan: Supportive approach/coping skills           CBT;nmindfulness           Address the cognitive distortions           Continue Prozac 40 mg daily  Medical Decision Making Problem Points:  Established problem, worsening (2) and Review of psycho-social stressors (1) Data Points:  Review of new medications or change in dosage (2)  I certify that inpatient services furnished can reasonably be expected to improve the patient's condition.   Raelyn Racette A 05/15/2014, 4:19 PM

## 2014-05-15 NOTE — Progress Notes (Signed)
D Pt is seen lying i her bed. She is unkem

## 2014-05-16 DIAGNOSIS — F42 Obsessive-compulsive disorder: Secondary | ICD-10-CM

## 2014-05-16 NOTE — Plan of Care (Signed)
Problem: Alteration in mood Goal: STG-Patient is able to discuss feelings and issues (Patient is able to discuss feelings and issues leading to depression)  Outcome: Progressing Patient took opportunity tonight to discuss frustrations and feelings - she referred to it as a "vent" - to this Clinical research associatewriter.   Problem: Diagnosis: Increased Risk For Suicide Attempt Goal: STG-Patient Will Attend All Groups On The Unit Outcome: Not Progressing Patient attends few groups if any on the unit. States, "what good do they do? I'm the same as I was when I came in."

## 2014-05-16 NOTE — Progress Notes (Signed)
Nursing Shift Note D: pt mood is depressed. Pt noted in bed most shift, slow to follow staff instructions. Pt encouraged to attend groups and interact with peers and staff. Denies SI, HI, AVH, Pain. A: support and encouragement given. Pt encouraged to come to staff with questions and or concerns. R: pt is in a safe and therapeutic environment. Will continue to monitor and stabilize.

## 2014-05-16 NOTE — Progress Notes (Signed)
[facility] Group Notes:  (Nursing/MHT/Case Management/Adjunct)  Date:  05/16/2014  Time:  11:31 PM  Type of Therapy:  AA  Participation Level:  Minimal  Participation Quality:  Appropriate  Affect:  Appropriate  Cognitive:  Appropriate  Insight:  Appropriate  Engagement in Group:  Engaged  Modes of Intervention:  Socialization and Support  Summary of Progress/Problems: Pt. Attended AA group.  Sondra ComeWilson, Towanna Avery J 05/16/2014, 11:31 PM

## 2014-05-16 NOTE — [facility] Group Notes (Signed)
[facility] Group Notes:  (Nursing/MHT/Case Management/Adjunct)  Date:  05/16/2014  Time:  4:23 PM  Type of Therapy:  Psychoeducational Skills- Healthy Coping Skills  Participation Level:  Did not attend. Jacquelyne BalintForrest, Kinleigh Nault Shanta 05/16/2014, 4:23 PM

## 2014-05-16 NOTE — Progress Notes (Signed)
Spoke with patient who remembers this Clinical research associatewriter from last week 1:1. Patient stated, "I'm no better than when I came in. I just need to take a minute to unload. No one understands what I am going through." Pt recapped events leading up to her depression (husband leaving, job loss, housing loss, son moving away). "I just need someone to say to me, Carlena SaxBlair, it's going to be okay but all my family does is say, you need to go get some help at the hospital. I can't lean on anyone. How do I go look for a job when I'm not okay? I'm smart and I used to look like a million bucks and now look at me. I don't know where to start. I don't know what kind of job I want." Patient offered support. Encouraged her to consider that it may be time for her to try to be her own reassuring voice since she feels let down by family. Pt resistant but did seem to hear what this writer was saying. She was tearful at times, flat and depressed. She denies SI/HI and remains safe. Lawrence MarseillesFriedman, Mauro Arps Eakes

## 2014-05-16 NOTE — Progress Notes (Signed)
Patient ID: Heather Reilly, female   DOB: [birth year], 48 y.o.   MRN: [id number] [facility]  05/16/2014 5:15 PM Heather Carwinnne B Deshotels  MRN:  [id number]  Subjective:  Pattricia Bossnnie reports, "I guess I'm okay"  O: [name] was assessed while in her bed. She is barely making eye contact. Presents with flat affects, withdrawn and monotonic. She does however, deny SIHI, AVH.  Diagnosis:   DSM5: Depressive Disorders:  Major Depressive Disorder - Severe (296.23) Total Time spent with patient: 30 minutes  Axis I: Obsessive Compulsive Disorder  ADL's:  Intact  Sleep: Fair  Appetite:  Poor   Psychiatric Specialty Exam: Physical Exam  Review of Systems  Constitutional: Positive for malaise/fatigue.  HENT: Negative.   Eyes: Negative.   Respiratory: Negative.   Cardiovascular: Negative.   Gastrointestinal: Negative.   Genitourinary: Negative.   Musculoskeletal: Negative.   Skin: Negative.   Neurological: Negative.   Endo/Heme/Allergies: Negative.   Psychiatric/Behavioral: Positive for depression. The patient is nervous/anxious and has insomnia.     Blood pressure 115/54, pulse 72, temperature 97.8 F (36.6 C), temperature source Oral, resp. rate 16, weight 50 kg (110 lb 3.7 oz), last menstrual period 05/14/2013.Body mass index is 20.16 kg/(m^2).  General Appearance: Fairly Groomed  Patent attorneyye Contact::  Minimal  Speech:  Clear and Coherent, Slow and not spontaneous  Volume:  Decreased  Mood:  Depressed  Affect:  Restricted  Thought Process:  Coherent and Goal Directed  Orientation:  Full (Time, Place, and Person)  Thought Content:  events, symptoms  Suicidal Thoughts:  No  Homicidal Thoughts:  No  Memory:  Immediate;   Fair Recent;   Fair Remote;   Fair  Judgement:  Fair  Insight:  Present  Psychomotor Activity:  Restlessness  Concentration:  Fair  Recall:  [employer]  Fund of Knowledge:NA  Language: Fair  Akathisia:  No  Handed:    AIMS (if indicated):     Assets:  Desire for  Improvement Housing  Sleep:  Number of Hours: 5.5   Musculoskeletal: Strength & Muscle Tone: within normal limits Gait & Station: normal Patient leans: N/A  Current Medications: Current Facility-Administered Medications  Medication Dose Route Frequency Provider Last Rate Last Dose  . acetaminophen (TYLENOL) tablet 650 mg  650 mg Oral Q6H PRN Court Joyharles E Kober, PA-C      . alum & mag hydroxide-simeth (MAALOX/MYLANTA) 200-200-20 MG/5ML suspension 30 mL  30 mL Oral Q4H PRN Court Joyharles E Kober, PA-C      . docusate sodium (COLACE) capsule 100 mg  100 mg Oral Daily Kerry HoughSpencer E Simon, PA-C   100 mg at 05/16/14 0859  . feeding supplement (ENSURE COMPLETE) (ENSURE COMPLETE) liquid 237 mL  237 mL Oral TID BM Tenny CrawHeather S Winkler, RD   237 mL at 05/16/14 1026  . FLUoxetine (PROZAC) capsule 40 mg  40 mg Oral Daily Rachael FeeIrving A Lugo, MD   40 mg at 05/16/14 0859  . hydrOXYzine (ATARAX/VISTARIL) tablet 25 mg  25 mg Oral Once Court Joyharles E Kober, PA-C      . hydrOXYzine (ATARAX/VISTARIL) tablet 25 mg  25 mg Oral Q6H PRN Court Joyharles E Kober, PA-C   25 mg at 05/16/14 1505  . Influenza vac split quadrivalent PF (FLUARIX) injection 0.5 mL  0.5 mL Intramuscular Tomorrow-1000 Nehemiah MassedFernando Cobos, MD      . magnesium hydroxide (MILK OF MAGNESIA) suspension 30 mL  30 mL Oral Daily PRN Court Joyharles E Kober, PA-C   30 mL at 05/11/14 2205  . mirtazapine (REMERON)  tablet 15 mg  15 mg Oral QHS Rachael Fee, MD   15 mg at 05/15/14 2257  . pneumococcal 23 valent vaccine (PNU-IMMUNE) injection 0.5 mL  0.5 mL Intramuscular Tomorrow-1000 Nehemiah Massed, MD        Lab Results: No results found for this or any previous visit (from the past 48 hour(s)).  Physical Findings: AIMS: Facial and Oral Movements Muscles of Facial Expression: None, normal Lips and Perioral Area: None, normal Jaw: None, normal Tongue: None, normal,Extremity Movements Upper (arms, wrists, hands, fingers): None, normal Lower (legs, knees, ankles, toes): None, normal, Trunk  Movements Neck, shoulders, hips: None, normal, Overall Severity Severity of abnormal movements (highest score from questions above): None, normal Incapacitation due to abnormal movements: None, normal Patient's awareness of abnormal movements (rate only patient's report): No Awareness, Dental Status Current problems with teeth and/or dentures?: No Does patient usually wear dentures?: No  CIWA:  CIWA-Ar Total: 1 COWS:  COWS Total Score: 2  Treatment Plan Summary: Daily contact with patient to assess and evaluate symptoms and progress in treatment Medication management  Plan: Supportive approach/coping skills CBT;nmindfulness Address the cognitive distortions, encourage group participation. Continue Prozac 40 mg daily for depression.  Medical Decision Making Problem Points:  Established problem, worsening (2) and Review of psycho-social stressors (1) Data Points:  Review of new medications or change in dosage (2)  I certify that inpatient services furnished can reasonably be expected to improve the patient's condition.   Armandina Stammer I, PMHNP. 05/16/2014, 5:15 PM  Patient seen, evaluated and I agree with notes by Nurse Practitioner. Thedore Mins, MD

## 2014-05-16 NOTE — [facility] Group Notes (Signed)
[facility] Group Notes: (Clinical Social Work)   05/16/2014      Type of Therapy:  Group Therapy   Participation Level:  Did Not Attend - refused   Ambrose MantleMareida Grossman-Orr, LCSW 05/16/2014, 12:49 PM

## 2014-05-16 NOTE — [facility] Group Notes (Signed)
[facility] Group Notes:  (Nursing/MHT/Case Management/Adjunct)  Date:  05/16/2014  Time:  11:42 AM  Type of Therapy:  Psychoeducational Skills- Patient Self Inventory  Participation Level:  Did Not Attend  Buford DresserForrest, Maziah Smola Shanta 05/16/2014, 11:42 AM

## 2014-05-17 NOTE — Progress Notes (Signed)
D: Pt presents anxious in affect and depressed in mood. Pt is hopeless and verbalizes that there is nothing that we can do to help her at this time. Pt was active within the milieu this evening. Pt is passive for SI. Pt verbally contracts for safety.  A: Pt was administered scheduled medication, per MD orders. Continued support and availability as needed was extended to this pt. Staff continue to monitor pt with q7015min checks.  R: No adverse drug reactions noted. Pt receptive to treatment. Pt remains safe at this time.

## 2014-05-17 NOTE — [facility] Group Notes (Signed)
[facility] Group Notes:  (Nursing/MHT/Case Management/Adjunct)  Date:  05/17/2014  Time:  5:51 PM  Type of Therapy:  Nurse Education  Participation Level:  Minimal  Participation Quality:  Appropriate and Sharing  Affect:  Irritable  Cognitive:  Alert  Insight:  Good  Engagement in Group:  Defensive and Poor  Modes of Intervention:  Activity, Discussion and Education  Summary of Progress/Problems: Pt was guarded seemed defensive and closed off.  She sat throughout group with her arms folded and irritable.  She did feel she could call a realtive that would be supportive and she plans to go to IOP from here.  Jule SerKent, Mattalynn Crandle Gail 05/17/2014, 5:51 PM

## 2014-05-17 NOTE — [facility] Group Notes (Signed)
[facility] Group Notes: (Clinical Social Work)   05/17/2014      Type of Therapy:  Group Therapy   Participation Level:  Did Not Attend - declined to leave her room   Ambrose MantleMareida Grossman-Orr, LCSW 05/17/2014, 12:50 PM

## 2014-05-17 NOTE — Progress Notes (Signed)
Pt's BP via [employer] indicates hypotension - 89/47 sitting and 81/50 standing. Pt slightly unsteady. Gatorade provided. Fall precautions reviewed. Manual BP obtained - 90/62. Pt resting in bed without complaint. Will inform day RN. Lawrence MarseillesFriedman, Geronimo Diliberto Eakes

## 2014-05-17 NOTE — [facility] Group Notes (Signed)
[facility] Group Notes:  (Nursing/MHT/Case Management/Adjunct)  Date:  05/17/2014  Time:  12:25 PM  Type of Therapy:  Psychoeducational Skills-Pt Self Inventory Group   Participation Level:  Did Not Attend   Heather Reilly Shanta 05/17/2014, 12:25 PM 

## 2014-05-17 NOTE — Progress Notes (Signed)
Patient did attend the evening speaker AA meeting.  

## 2014-05-17 NOTE — Progress Notes (Signed)
Patient ID: Heather Reilly, female   DOB: February 06, 1966, 48 y.o.   MRN: [id number] Patient ID: Heather Reilly, female   DOB: February 06, 1966, 48 y.o.   MRN: [id number] [facility]  05/17/2014 12:49 PM Heather Reilly  MRN:  [id number]  Subjective:  Heather Reilly reports, "I'm not doing well. I lost my husband, my daughter and my home. I'm at my worst. I keep cluing people that I need help. I don't know how to pick myself up. But, what did they do, threw me into a mental hospital. Here I am, then what?"  O: Heather Reilly was assessed while in the day room. She is making good eye contact. Presents with flat affect/sad facial expression, withdrawn and monotonic. She does however, deny SIHI, AVH. She says her appetite is poor because she is not hungry.  Diagnosis:   DSM5: Depressive Disorders:  Major Depressive Disorder - Severe (296.23) Total Time spent with patient: 30 minutes  Axis I: Obsessive Compulsive Disorder  ADL's:  Intact  Sleep: Fair  Appetite:  Poor  Psychiatric Specialty Exam: Physical Exam  Review of Systems  Constitutional: Positive for malaise/fatigue.  HENT: Negative.   Eyes: Negative.   Respiratory: Negative.   Cardiovascular: Negative.   Gastrointestinal: Negative.   Genitourinary: Negative.   Musculoskeletal: Negative.   Skin: Negative.   Neurological: Negative.   Endo/Heme/Allergies: Negative.   Psychiatric/Behavioral: Positive for depression. The patient is nervous/anxious and has insomnia.     Blood pressure 92/60, pulse 72, temperature 97.8 F (36.6 C), temperature source Oral, resp. rate 16, weight 50 kg (110 lb 3.7 oz), last menstrual period 05/14/2013.Body mass index is 20.16 kg/(m^2).  General Appearance: Fairly Groomed  Patent attorneyye Contact::  Minimal  Speech:  Clear and Coherent, Slow and not spontaneous  Volume:  Decreased  Mood:  Depressed  Affect:  Flat  Thought Process:  Coherent and Goal Directed  Orientation:  Full (Time, Place, and Person)  Thought Content:   events, symptoms  Suicidal Thoughts:  No  Homicidal Thoughts:  No  Memory:  Immediate;   Fair Recent;   Fair Remote;   Fair  Judgement:  Fair  Insight:  Present  Psychomotor Activity:  Decreased  Concentration:  Fair  Recall:  FiservFair  Fund of Knowledge:NA  Language: Fair  Akathisia:  No  Handed:    AIMS (if indicated):     Assets:  Desire for Improvement Housing  Sleep:  Number of Hours: 5.75   Musculoskeletal: Strength & Muscle Tone: within normal limits Gait & Station: normal Patient leans: N/A  Current Medications: Current Facility-Administered Medications  Medication Dose Route Frequency Provider Last Rate Last Dose  . acetaminophen (TYLENOL) tablet 650 mg  650 mg Oral Q6H PRN Court Joyharles E Kober, PA-C      . alum & mag hydroxide-simeth (MAALOX/MYLANTA) 200-200-20 MG/5ML suspension 30 mL  30 mL Oral Q4H PRN Court Joyharles E Kober, PA-C      . docusate sodium (COLACE) capsule 100 mg  100 mg Oral Daily Kerry HoughSpencer E Simon, PA-C   100 mg at 05/17/14 0859  . feeding supplement (ENSURE COMPLETE) (ENSURE COMPLETE) liquid 237 mL  237 mL Oral TID BM Tenny CrawHeather S Winkler, RD   237 mL at 05/16/14 1026  . FLUoxetine (PROZAC) capsule 40 mg  40 mg Oral Daily Rachael FeeIrving A Lugo, MD   40 mg at 05/17/14 0859  . hydrOXYzine (ATARAX/VISTARIL) tablet 25 mg  25 mg Oral Once Court Joyharles E Kober, PA-C      . hydrOXYzine (ATARAX/VISTARIL)  tablet 25 mg  25 mg Oral Q6H PRN Court Joy, PA-C   25 mg at 05/16/14 1505  . Influenza vac split quadrivalent PF (FLUARIX) injection 0.5 mL  0.5 mL Intramuscular Tomorrow-1000 Nehemiah Massed, MD      . magnesium hydroxide (MILK OF MAGNESIA) suspension 30 mL  30 mL Oral Daily PRN Court Joy, PA-C   30 mL at 05/11/14 2205  . mirtazapine (REMERON) tablet 15 mg  15 mg Oral QHS Rachael Fee, MD   15 mg at 05/16/14 2243  . pneumococcal 23 valent vaccine (PNU-IMMUNE) injection 0.5 mL  0.5 mL Intramuscular Tomorrow-1000 Nehemiah Massed, MD        Lab Results: No results found for this  or any previous visit (from the past 48 hour(s)).  Physical Findings: AIMS: Facial and Oral Movements Muscles of Facial Expression: None, normal Lips and Perioral Area: None, normal Jaw: None, normal Tongue: None, normal,Extremity Movements Upper (arms, wrists, hands, fingers): None, normal Lower (legs, knees, ankles, toes): None, normal, Trunk Movements Neck, shoulders, hips: None, normal, Overall Severity Severity of abnormal movements (highest score from questions above): None, normal Incapacitation due to abnormal movements: None, normal Patient's awareness of abnormal movements (rate only patient's report): No Awareness, Dental Status Current problems with teeth and/or dentures?: No Does patient usually wear dentures?: No  CIWA:  CIWA-Ar Total: 1 COWS:  COWS Total Score: 2  Treatment Plan Summary: Daily contact with patient to assess and evaluate symptoms and progress in treatment Medication management  Plan: Supportive approach/coping skills Continue CBT; mindfulness, encouragement Address the cognitive distortions, encourage group participation. Continue Prozac 40 mg daily for depression.  Medical Decision Making Problem Points:  Established problem, worsening (2) and Review of psycho-social stressors (1) Data Points:  Review of new medications or change in dosage (2)  I certify that inpatient services furnished can reasonably be expected to improve the patient's condition.   Armandina Stammer I, PMHNP. 05/17/2014, 12:49 PM

## 2014-05-17 NOTE — Progress Notes (Signed)
Pt has been in bed most of the day.  She refused to fill out her self-inventory.  She did rated all her depression, hopelessness and anxiety a 8.  She denied any S/H ideation or A/V/H.  She did attend the afternoon group with minimal participation. She did remain up in dayroom with her peers interacting and watching tv and playing cards. She did request a prn vistaril at 1404 which was effective for her.  She did talk today about going to IOP from here.

## 2014-05-18 DIAGNOSIS — F332 Major depressive disorder, recurrent severe without psychotic features: Principal | ICD-10-CM

## 2014-05-18 MED ORDER — MIRTAZAPINE 30 MG PO TABS
30.0000 mg | ORAL_TABLET | Freq: Every day | ORAL | Status: DC
  Filled 2014-05-18: qty 1
  Filled 2014-05-18: qty 14

## 2014-05-18 MED ORDER — HYDROXYZINE HCL 25 MG PO TABS: ORAL_TABLET | ORAL | Status: DC

## 2014-05-18 MED ORDER — FLUOXETINE HCL 40 MG PO CAPS: 40.0000 mg | ORAL_CAPSULE | Freq: Every day | ORAL | Status: DC

## 2014-05-18 MED ORDER — HYDROXYZINE HCL 25 MG PO TABS
25.0000 mg | ORAL_TABLET | Freq: Three times a day (TID) | ORAL | Status: DC | PRN
  Filled 2014-05-18: qty 30

## 2014-05-18 MED ORDER — MIRTAZAPINE 30 MG PO TABS: 30.0000 mg | ORAL_TABLET | Freq: Every day | ORAL | Status: DC

## 2014-05-18 NOTE — Clinical Social Work Note (Signed)
CSW left voicemail for Jeri Modenaita Clark regarding scholarship application for IOP. Awaiting return call.  Samuella BruinKristin Timberly Yott, MSW, [employer] IncLCSWA Clinical Social Worker [facility] [phone]

## 2014-05-18 NOTE — [facility] Suicide Risk Assessment (Signed)
Suicide Risk Assessment  Discharge Assessment     Demographic Factors:  Caucasian and Living alone  Total Time spent with patient: 45 minutes  Psychiatric Specialty Exam:     Blood pressure 90/69, pulse 66, temperature 98.2 F (36.8 C), temperature source Oral, resp. rate 18, weight 50 kg (110 lb 3.7 oz), last menstrual period 05/14/2013.Body mass index is 20.16 kg/(m^2).  General Appearance: Fairly Groomed  Patent attorneyye Contact::  Fair  Speech:  Clear and Coherent  Volume:  Normal  Mood:  Anxious and worried  Affect:  anxious, worried  Thought Process:  Coherent and Goal Directed  Orientation:  Full (Time, Place, and Person)  Thought Content:  plans as she moves on, worries and concerns but denies SI plans or intent  Suicidal Thoughts:  No  Homicidal Thoughts:  No  Memory:  Immediate;   Fair Recent;   Fair Remote;   Fair  Judgement:  Fair  Insight:  Present and Shallow  Psychomotor Activity:  Normal  Concentration:  Fair  Recall:  FiservFair  Fund of Knowledge:NA  Language: Fair  Akathisia:  No  Handed:    AIMS (if indicated):     Assets:  Desire for Improvement Housing  Sleep:  Number of Hours: 5    Musculoskeletal: Strength & Muscle Tone: within normal limits Gait & Station: normal Patient leans: N/A   Mental Status Per Nursing Assessment::   On Admission:  Suicidal ideation indicated by patient  Current Mental Status by Physician: In full contact with reality. Endorses no active suicidal ideas, plans or intent. Admits she is still worried about how she is going to function once she gets back to the house. Sates she is going to prioritize getting a job. States if she gets a job, and has a source of income she is going to be able to keep her house. She also states if she gets a job it is going to help her sense of self worth. She is willing to pursue psychotherapy. She is also willing to give medications a longer period of time before expecting to see results. She continues to  evidence catastrophic thinking as well as responding to a lot "Should Statements." CBT Could be particularly effective for her   Loss Factors: Decrease in vocational status, Loss of significant relationship and Financial problems/change in socioeconomic status  Historical Factors: NA  Risk Reduction Factors:   Responsible for children under 50 years of age and Sense of responsibility to family  Continued Clinical Symptoms:  Depression:   Insomnia Obsessive-Compulsive Disorder  Cognitive Features That Contribute To Risk:  Closed-mindedness Polarized thinking Thought constriction (tunnel vision)    Suicide Risk:  Minimal: No identifiable suicidal ideation.  Patients presenting with no risk factors but with morbid ruminations; may be classified as minimal risk based on the severity of the depressive symptoms  Discharge Diagnoses:   AXIS I:  Major Depression Recurrent severe, OCD, GAD AXIS II:  No diagnosis AXIS III:   Past Medical History  Diagnosis Date  . Anxiety   . Depression    AXIS IV:  other psychosocial or environmental problems AXIS V:  61-70 mild symptoms  Plan Of Care/Follow-up recommendations:  Activity:  as tolerated Diet:  regular Follow up outpatient basis: IOP/medication management/Individual Psychotherapy (CBT preferred) Is patient on multiple antipsychotic therapies at discharge:  No   Has Patient had three or more failed trials of antipsychotic monotherapy by history:  No  Recommended Plan for Multiple Antipsychotic Therapies: NA    Heather Reilly  A 05/18/2014, 1:48 PM

## 2014-05-18 NOTE — Progress Notes (Signed)
Adult Psychoeducational Group Note  Date:  05/18/2014 Time:  10:00am Group Topic/Focus:  Therapeutic Activity  Participation Level:  Active  Participation Quality:  Appropriate and Attentive  Affect:  Appropriate  Cognitive:  Alert and Appropriate  Insight: Appropriate  Engagement in Group:  Engaged  Modes of Intervention:  Discussion and Education  Additional Comments:   Pt attended group and discussion was on name one positive thing about yourself. Pt stated she is very smart  Shelly BombardGarner, Kayin Kettering D 05/18/2014, 11:19 AM

## 2014-05-18 NOTE — Progress Notes (Signed)
Patient discharged per physician order; patient denies SI/HI and A/V hallucinations; patient received samples, prescriptions, and copy of AVS after it was reviewed; patient had no other questions or concerns at this time; patient verbalized and signed that they received all belongings with Donna S. RN; patient left the unit ambulatory with Donna S. RN 

## 2014-05-18 NOTE — [facility] Group Notes (Signed)
[facility] LCSW Group Therapy 05/18/2014  1:15 pm  Type of Therapy: Group Therapy Participation Level: Active  Participation Quality: Attentive, Sharing and Supportive  Affect: Depressed and Flat  Cognitive: Alert and Oriented  Insight: Developing/Improving and Engaged  Engagement in Therapy: Developing/Improving and Engaged  Modes of Intervention: Clarification, Confrontation, Discussion, Education, Exploration,  Limit-setting, Orientation, Problem-solving, Rapport Building, Dance movement psychotherapisteality Testing, Socialization and Support  Summary of Progress/Problems: Pt identified obstacles faced currently and processed barriers involved in overcoming these obstacles. Pt identified steps necessary for overcoming these obstacles and explored motivation (internal and external) for facing these difficulties head on. Pt further identified one area of concern in their lives and chose a goal to focus on for today. Patient identified her primary obstacle as "being alone". Patient discussed her need for a support system but her lack of motivation to pursue one due to lack of confidence in herself. CSW and other group members provided patient with emotional support and encouragement.  Samuella BruinKristin Polly Barner, MSW, [employer]LCSWA Clinical Social Worker [facility] [phone]

## 2014-05-18 NOTE — Discharge Summary (Signed)
Physician Discharge Summary Note  Patient:  Heather Reilly is an 48 y.o., female MRN:  [id number] DOB:  [birth year] Patient phone:  [phone] (home)  Patient address:   [address] [place] [address],  Total Time spent with patient: Greater than 30 minutes  Date of Admission:  05/09/2014 Date of Discharge: 05/19/14  Reason for Admission:  Mood stabilization treatment  Discharge Diagnoses: Principal Problem:   Severe major depression without psychotic features Active Problems:   OCD (obsessive compulsive disorder)   Psychiatric Specialty Exam: Physical Exam  Psychiatric: Her speech is normal and behavior is normal. Judgment and thought content normal. Her mood appears not anxious. Her affect is not angry, not blunt, not labile and not inappropriate. Cognition and memory are normal. She does not exhibit a depressed mood.    Review of Systems  Constitutional: Negative.   HENT: Negative.   Eyes: Negative.   Respiratory: Negative.   Cardiovascular: Negative.   Gastrointestinal: Negative.   Genitourinary: Negative.   Musculoskeletal: Negative.   Skin: Negative.   Neurological: Negative.   Endo/Heme/Allergies: Negative.   Psychiatric/Behavioral: Positive for depression (Stable) and substance abuse (Benzodiazepine dependence). Negative for suicidal ideas, hallucinations and memory loss. The patient has insomnia (Stable). The patient is not nervous/anxious.     Blood pressure 90/69, pulse 66, temperature 98.2 F (36.8 C), temperature source Oral, resp. rate 18, weight 50 kg (110 lb 3.7 oz), last menstrual period 05/14/2013.Body mass index is 20.16 kg/(m^2).   General Appearance: Fairly Groomed   Patent attorney:: Fair   Speech: Clear and Coherent   Volume: Normal   Mood: Anxious and worried   Affect: anxious, worried   Thought Process: Coherent and Goal Directed   Orientation: Full (Time, Place, and Person)   Thought Content: plans as she moves on, worries and concerns but  denies SI plans or intent   Suicidal Thoughts: No   Homicidal Thoughts: No   Memory: Immediate; Fair  Recent; Fair  Remote; Fair   Judgement: Fair   Insight: Present and Shallow   Psychomotor Activity: Normal   Concentration: Fair   Recall: [employer] of Knowledge:NA   Language: Fair   Akathisia: No   Handed:   AIMS (if indicated):   Assets: Desire for Improvement  Housing   Sleep: Number of Hours: 5    Past Psychiatric History: Diagnosis: Severe major depression without psychotic features, OCD, Benzodiazepine abuse, continuous  Hospitalizations: [facility] x multiple times  Outpatient Care: [facility]  Substance Abuse Care: None reported  Self-Mutilation: None reported  Suicidal Attempts: None reported  Violent Behaviors: None reported   Musculoskeletal: Strength & Muscle Tone: within normal limits Gait & Station: normal Patient leans: N/A  DSM5: Schizophrenia Disorders:  NA Obsessive-Compulsive Disorders: OCD Trauma-Stressor Disorders:  NA Substance/Addictive Disorders:  Benzodiazepine abuse, continuous Depressive Disorders:  Severe major depression without psychotic features  Axis Diagnosis:  AXIS I:  Severe major depression without psychotic features AXIS II:  Deferred AXIS III:   Past Medical History  Diagnosis Date  . Anxiety   . Depression    AXIS IV:  economic problems, housing problems, occupational problems and other psychosocial or environmental problems AXIS V:  63  Level of Care:  OP  Hospital Course:  Patient reports increasing depression, with decreased motivation since 2008 when her husband "just left me". Worsening symptoms as she lost her job, her son moved to [place] and she lost her house. Her 58 yo daughter now lives with her ex-husband and  she only sees her on the week-ends" She repeats "I just want to die, there is no way out, nobody can help me". "I don't have any insurance". She avoids the question of feeling suicidal, "that question has made me  stay here for seven days" Denies A/V hallucinations and HI. "I just want someone to get in my head and untwist it", she verbalizes her desire for one on one counseling OP.  While a patient in this hospital, Adiva was treated for Major depression with Prozac 40 mg, mirtazapine 30 mg for insomnia/depression and Hydroxyzine 25 mg for anxiety. She was enrolled in the group counseling sessions being offered and held on this unit. Leticia learned coping skills. However, Brandolyn was noted to ruminate a lot about losing her husband, home and no longer living with any of her 2 children. She appeared genuinely depressed and will require to remain on medication and will certainly benefit from counseling services after discharge. These are incorporated in her discharge plans and decision.  Reeta's symptoms responded well to her treatment regimen. She finally came around reporting improved mood, absence of suicidal ideations and presentation of good affects. She is currently being discharged to continue psychiatric treatment and medication management at the [facility] clinic here in [place], [place]. And for counseling services, Nolyn will be seeing and working with Norton Blizzard at the [facility] here in [place], [place]. She is provided with all the necessary information required to make these appointments without problems. She received a 14 days worth, supply samples of her [facility] discharge medications. Yaminah left [facility] with all personal belongings in no apparent distress. Transportation per patient arrangement.  Consults:  psychiatry  Significant Diagnostic Studies:  labs: CBC with diff, CMP, UDS, toxicology tests, U/A  Discharge Vitals:   Blood pressure 90/69, pulse 66, temperature 98.2 F (36.8 C), temperature source Oral, resp. rate 18, weight 50 kg (110 lb 3.7 oz), last menstrual period 05/14/2013. Body mass index is 20.16 kg/(m^2). Lab Results:   No results found for this or any previous visit (from the past 72  hour(s)).  Physical Findings: AIMS: Facial and Oral Movements Muscles of Facial Expression: None, normal Lips and Perioral Area: None, normal Jaw: None, normal Tongue: None, normal,Extremity Movements Upper (arms, wrists, hands, fingers): None, normal Lower (legs, knees, ankles, toes): None, normal, Trunk Movements Neck, shoulders, hips: None, normal, Overall Severity Severity of abnormal movements (highest score from questions above): None, normal Incapacitation due to abnormal movements: None, normal Patient's awareness of abnormal movements (rate only patient's report): No Awareness, Dental Status Current problems with teeth and/or dentures?: No Does patient usually wear dentures?: No  CIWA:  CIWA-Ar Total: 1 COWS:  COWS Total Score: 2  Psychiatric Specialty Exam: See Psychiatric Specialty Exam and Suicide Risk Assessment completed by Attending Physician prior to discharge.  Discharge destination:  Home  Is patient on multiple antipsychotic therapies at discharge:  No   Has Patient had three or more failed trials of antipsychotic monotherapy by history:  No  Recommended Plan for Multiple Antipsychotic Therapies: NA    Medication List    STOP taking these medications       diphenhydrAMINE 25 mg capsule  Commonly known as:  BENADRYL     OVER THE COUNTER MEDICATION     ZOLPIDEM TARTRATE PO      TAKE these medications     Indication   FLUoxetine 40 MG capsule  Commonly known as:  PROZAC  Take 1 capsule (40 mg total) by mouth  daily. For depression   Indication:  Major Depressive Disorder     hydrOXYzine 25 MG tablet  Commonly known as:  ATARAX/VISTARIL  Take 1 tablet (25 mg) three times daily as needed: For anxiety   Indication:  Tension, Anxiety     mirtazapine 30 MG tablet  Commonly known as:  REMERON  Take 1 tablet (30 mg total) by mouth at bedtime. For depression/sleep   Indication:  Trouble Sleeping, Major Depressive Disorder           Follow-up  Information   Follow up with [facility]  On 05/20/2014. (Please go to [facility]'s walk in clinic on Wednesday, May 20, 2014 or any weekday between 8AM - 3PM for medication management)    Contact information:   [address] [place], [place]   [address]    [phone]      Follow up with [facility] On 05/19/2014. (You are scheduled with [facility] on Tuesday, May 19, 2014 at [facility]4PM)    Contact information:   [address] [place], [place]   [address]  [phone]     Follow-up recommendations:  Activity:  As tolerated Diet: As recommended by your primary care doctor. Keep all scheduled follow-up appointments as recommended.  Comments: Take all your medications as prescribed by your mental healthcare provider. Report any adverse effects and or reactions from your medicines to your outpatient provider promptly. Patient is instructed and cautioned to not engage in alcohol and or illegal drug use while on prescription medicines. In the event of worsening symptoms, patient is instructed to call the crisis hotline, 911 and or go to the nearest ED for appropriate evaluation and treatment of symptoms. Follow-up with your primary care provider for your other medical issues, concerns and or health care needs.   Total Discharge Time:  Greater than 30 minutes.  Signed: Armandina StammerNwoko, Agnes I, PMHNP-BC 05/18/2014, 4:53 PM I personally assessed the patient and formulated the plan Madie RenoIrving A. Dub MikesLugo, M.D.

## 2014-05-18 NOTE — [facility] Group Notes (Signed)
   [facility]  05/18/2014  8:45 AM   Participation Quality: Alert, Appropriate and Oriented  Mood/Affect: Depressed and Flat  Depression Rating: 5  Anxiety Rating: 7  Thoughts of Suicide: Pt denies SI/HI  Will you contract for safety? Yes  Current AVH: Pt denies  Plan for Discharge/Comments: Pt attended discharge planning group and actively participated in group. CSW provided pt with today's workbook. Patient reports that she feels "okay" today. She denies SI/HI. Patient reports interest in [facility] and inquired about affording her medications. CSW discussed [facility] and prescription assistance programs with group. Patient appeared confused about her follow up plan, CSW to speak with patient in more detail individually after group.  Transportation Means: Pt reports access to transportation  Supports: No supports mentioned at this time  Samuella BruinKristin Bristal Steffy, MSW, [employer]CSWA Clinical Social Worker [facility] [phone]

## 2014-05-18 NOTE — Progress Notes (Signed)
[facility] :  Will you be returning to the same living situation after discharge: Yes,  patient will be returning home At discharge, do you have transportation home?:Yes,  patient reports access to transportation Do you have the ability to pay for your medications:Yes,  patient will be provided with medication samples and prescriptions at discharge.  Release of information consent forms completed and in the chart;  Patient's signature needed at discharge.  Patient to Follow up at: Follow-up Information   Follow up with [facility]  On 05/20/2014. (Please go to [facility]'s walk in clinic on Wednesday, May 20, 2014 or any weekday between 8AM - 3PM for medication management)    Contact information:   [address] [place], [place]   [address]    [phone]      [facility] up with [facility] On 05/19/2014. (You are scheduled with [facility] on Tuesday, May 19, 2014 at [facility]PM)    Contact information:   [address] [place], [place]   [address]  [phone]      Patient denies SI/HI:   Yes,  denies    Safety Planning and Suicide Prevention discussed:  Yes,  with patient and sister  Heather Reilly, Dupree Givler L 05/18/2014, 4:08 PM

## 2014-05-18 NOTE — Progress Notes (Signed)
[facility] :  Will you be returning to the same living situation after discharge: Yes,  patient will be returning home At discharge, do you have transportation home?:Yes,  patient will have transportation home Do you have the ability to pay for your medications:Yes,  patient will be provided with medication samples and prescriptions at discharge.   Release of information consent forms completed and in the chart;  Patient's signature needed at discharge.  Patient to Follow up at: Follow-up Information   Follow up with [facility]  On 05/20/2014. (Please go to [facility]'s walk in clinic on Wednesday, May 20, 2014 or any weekday between 8AM - 3PM for medication management)    Contact information:   [address] [place], [place]   [address]    [phone]      [facility] with [facility] On 05/19/2014. (You are scheduled with [facility] on Tuesday, May 19, 2014 at [facility]4PM)    Contact information:   [address] [place], [place]   [address]  [phone]      Patient denies SI/HI:   Yes,  denies    Safety Planning and Suicide Prevention discussed:  Yes,  with patient and family  Aman Batley, [place] CarboKristin L 05/18/2014, 12:27 PM

## 2014-05-21 NOTE — Progress Notes (Signed)
Patient Discharge Instructions:  After Visit Summary (AVS):   Faxed to:  05/21/14 Discharge Summary Note:   Faxed to:  05/21/14 Psychiatric Admission Assessment Note:   Faxed to:  05/21/14 Suicide Risk Assessment - Discharge Assessment:   Faxed to:  05/21/14 Faxed/Sent to the Next Level Care provider:  05/21/14 [facility]axed to [facility] @ [phone] [facility] @ [phone]  Jerelene ReddenSheena E Choccolocco, 05/21/2014, 3:12 PM

## 2015-07-19 ENCOUNTER — Emergency Department (HOSPITAL_COMMUNITY)
Admission: EM | Admit: 2015-07-19 | Discharge: 2015-07-19 | Disposition: A | Discharge status: Home / Self Care | Payer: 59 | Source: Home / Self Care | Attending: Family Medicine | Admitting: Family Medicine

## 2015-07-19 ENCOUNTER — Encounter (HOSPITAL_COMMUNITY): Payer: Self-pay | Admitting: *Deleted

## 2015-07-19 DIAGNOSIS — S01312A Laceration without foreign body of left ear, initial encounter: Secondary | ICD-10-CM

## 2015-07-19 NOTE — ED Notes (Signed)
Pt  Sustained   A  Laceration  To  l   Earlobe       Today   The   A  Youngster   Pulled  On  The  [place]obe      Today

## 2015-07-19 NOTE — Discharge Instructions (Signed)
Call doctor for repair of earlobe .

## 2015-07-19 NOTE — ED Provider Notes (Signed)
CSN: [id number]     Arrival date & time 07/19/15  1358 History   First MD Initiated Contact with Patient 07/19/15 1622     Chief Complaint  Patient presents with  . Ear Laceration   (Consider location/radiation/quality/duration/timing/severity/associated sxs/prior Treatment) Patient is a 49 y.o. female presenting with skin laceration. The history is provided by the patient.  Laceration Location:  Head/neck Head/neck laceration location:  L ear Depth:  Cutaneous Quality: straight   Bleeding: controlled   Time since incident:  6 hours Injury mechanism: pt had earring tore pinna when pulled by child.   Past Medical History  Diagnosis Date  . Anxiety   . Depression    Past Surgical History  Procedure Laterality Date  . No past surgeries     History reviewed. No pertinent family history. Social History  Substance Use Topics  . Smoking status: Former Smoker -- 0.50 packs/day for .5 years    Types: Cigarettes  . Smokeless tobacco: Never Used  . Alcohol Use: Yes     Comment: one marjuarita every 2 months   OB History    No data available     Review of Systems  Skin: Positive for wound.  All other systems reviewed and are negative.   Allergies  Penicillins; Ambien; Sulfa antibiotics; and Trazodone and nefazodone  Home Medications   Prior to Admission medications   Medication Sig Start Date End Date Taking? Authorizing Provider  FLUoxetine (PROZAC) 40 MG capsule Take 1 capsule (40 mg total) by mouth daily. For depression 05/18/14   Sanjuana KavaAgnes I Nwoko, NP  hydrOXYzine (ATARAX/VISTARIL) 25 MG tablet Take 1 tablet (25 mg) three times daily as needed: For anxiety 05/18/14   Sanjuana KavaAgnes I Nwoko, NP  mirtazapine (REMERON) 30 MG tablet Take 1 tablet (30 mg total) by mouth at bedtime. For depression/sleep 05/18/14   Sanjuana KavaAgnes I Nwoko, NP   Meds Ordered and Administered this Visit  Medications - No data to display  BP 136/83 mmHg  Pulse 62  Temp(Src) 98.1 F (36.7 C) (Oral)  Resp 16  SpO2  100%  LMP 05/14/2013 No data found.   Physical Exam  Constitutional: She is oriented to person, place, and time. She appears well-developed and well-nourished.  Neurological: She is alert and oriented to person, place, and time.  Skin: Skin is warm and dry.  Lower pole of left pinna torn but not bleeding.  Nursing note and vitals reviewed.   ED Course  Procedures (including critical care time)  Labs Review Labs Reviewed - No data to display  Imaging Review No results found.   Visual Acuity Review  Right Eye Distance:   Left Eye Distance:   Bilateral Distance:    Right Eye Near:   Left Eye Near:    Bilateral Near:         MDM   1. Tear of earlobe, left, initial encounter    Discussed with pt nature of repair and need for plastic repair, she agrees.    Linna HoffJames D Kindl, MD 07/19/15 [phone]

## 2015-10-05 ENCOUNTER — Other Ambulatory Visit: Payer: Self-pay

## 2015-10-05 DIAGNOSIS — Z1231 Encounter for screening mammogram for malignant neoplasm of breast: Secondary | ICD-10-CM

## 2015-10-21 ENCOUNTER — Ambulatory Visit: Payer: Self-pay

## 2015-11-02 ENCOUNTER — Ambulatory Visit
Admission: RE | Admit: 2015-11-02 | Discharge: 2015-11-02 | Disposition: A | Discharge status: Home / Self Care | Payer: Managed Care, Other (non HMO) | Source: Ambulatory Visit

## 2015-11-02 DIAGNOSIS — Z1231 Encounter for screening mammogram for malignant neoplasm of breast: Secondary | ICD-10-CM

## 2017-12-27 DIAGNOSIS — L255 Unspecified contact dermatitis due to plants, except food: Secondary | ICD-10-CM | POA: Diagnosis not present

## 2018-03-06 DIAGNOSIS — K5900 Constipation, unspecified: Secondary | ICD-10-CM | POA: Diagnosis not present

## 2018-03-06 DIAGNOSIS — N760 Acute vaginitis: Secondary | ICD-10-CM | POA: Diagnosis not present

## 2018-03-06 DIAGNOSIS — F329 Major depressive disorder, single episode, unspecified: Secondary | ICD-10-CM | POA: Diagnosis not present

## 2018-03-06 DIAGNOSIS — Z1231 Encounter for screening mammogram for malignant neoplasm of breast: Secondary | ICD-10-CM | POA: Diagnosis not present

## 2018-03-06 DIAGNOSIS — Z01419 Encounter for gynecological examination (general) (routine) without abnormal findings: Secondary | ICD-10-CM | POA: Diagnosis not present

## 2018-03-06 DIAGNOSIS — M542 Cervicalgia: Secondary | ICD-10-CM | POA: Diagnosis not present

## 2018-03-06 DIAGNOSIS — Z6825 Body mass index (BMI) 25.0-25.9, adult: Secondary | ICD-10-CM | POA: Diagnosis not present

## 2018-04-16 DIAGNOSIS — R143 Flatulence: Secondary | ICD-10-CM | POA: Diagnosis not present

## 2018-04-16 DIAGNOSIS — F411 Generalized anxiety disorder: Secondary | ICD-10-CM | POA: Diagnosis not present

## 2018-05-21 DIAGNOSIS — F411 Generalized anxiety disorder: Secondary | ICD-10-CM | POA: Diagnosis not present

## 2018-05-21 DIAGNOSIS — L237 Allergic contact dermatitis due to plants, except food: Secondary | ICD-10-CM | POA: Diagnosis not present

## 2018-06-03 DIAGNOSIS — D509 Iron deficiency anemia, unspecified: Secondary | ICD-10-CM | POA: Diagnosis not present

## 2018-07-02 DIAGNOSIS — N761 Subacute and chronic vaginitis: Secondary | ICD-10-CM | POA: Diagnosis not present

## 2019-01-15 DIAGNOSIS — Z03818 Encounter for observation for suspected exposure to other biological agents ruled out: Secondary | ICD-10-CM | POA: Diagnosis not present

## 2019-01-25 ENCOUNTER — Encounter: Payer: Self-pay | Admitting: Emergency Medicine

## 2019-01-25 ENCOUNTER — Emergency Department
Admission: EM | Admit: 2019-01-25 | Discharge: 2019-01-25 | Disposition: A | Discharge status: Home / Self Care | Payer: BC Managed Care – PPO | Attending: Emergency Medicine | Admitting: Emergency Medicine

## 2019-01-25 ENCOUNTER — Other Ambulatory Visit: Payer: Self-pay

## 2019-01-25 DIAGNOSIS — W57XXXA Bitten or stung by nonvenomous insect and other nonvenomous arthropods, initial encounter: Secondary | ICD-10-CM | POA: Diagnosis not present

## 2019-01-25 DIAGNOSIS — L509 Urticaria, unspecified: Secondary | ICD-10-CM | POA: Insufficient documentation

## 2019-01-25 DIAGNOSIS — Z79899 Other long term (current) drug therapy: Secondary | ICD-10-CM | POA: Insufficient documentation

## 2019-01-25 DIAGNOSIS — L500 Allergic urticaria: Secondary | ICD-10-CM | POA: Diagnosis not present

## 2019-01-25 MED ORDER — SODIUM CHLORIDE 0.9 % IV BOLUS
1000.0000 mL | Freq: Once | INTRAVENOUS | Status: AC
  Administered 2019-01-25: 1000 mL via INTRAVENOUS

## 2019-01-25 MED ORDER — HYDROXYZINE HCL 50 MG PO TABS: 50.0000 mg | ORAL_TABLET | Freq: Three times a day (TID) | ORAL | 0 refills | Status: DC | PRN

## 2019-01-25 MED ORDER — METHYLPREDNISOLONE 4 MG PO TBPK: ORAL_TABLET | ORAL | 0 refills | Status: DC

## 2019-01-25 MED ORDER — FAMOTIDINE IN NACL 20-0.9 MG/50ML-% IV SOLN
20.0000 mg | Freq: Once | INTRAVENOUS | Status: AC
  Administered 2019-01-25: 20 mg via INTRAVENOUS
  Filled 2019-01-25: qty 50

## 2019-01-25 MED ORDER — METHYLPREDNISOLONE SODIUM SUCC 125 MG IJ SOLR
80.0000 mg | Freq: Once | INTRAMUSCULAR | Status: AC
  Administered 2019-01-25: 80 mg via INTRAVENOUS

## 2019-01-25 MED ORDER — METHYLPREDNISOLONE SODIUM SUCC 125 MG IJ SOLR
80.0000 mg | Freq: Once | INTRAMUSCULAR | Status: DC
  Filled 2019-01-25: qty 2

## 2019-01-25 MED ORDER — DIPHENHYDRAMINE HCL 50 MG/ML IJ SOLN
25.0000 mg | Freq: Once | INTRAMUSCULAR | Status: AC
Start: 2019-01-25 — End: 2019-01-25
  Administered 2019-01-25: 25 mg via INTRAVENOUS
  Filled 2019-01-25: qty 1

## 2019-01-25 NOTE — ED Notes (Signed)
Patient ambulated to and from room commode with a steady gait. 

## 2019-01-25 NOTE — ED Provider Notes (Signed)
[facility] Emergency Department Provider Note   ____________________________________________   First MD Initiated Contact with Patient 01/25/19 1710     (approximate)  I have reviewed the triage vital signs and the nursing notes.   HISTORY  Chief Complaint Allergic Reaction    HPI Heather Reilly is a 53 y.o. female patient presents with hives secondary to insect bite.  Patient state prior to arrival she was working on a ladder and felt something bite her left breast.  Patient started developing in rash, itching and swollen of the left eye.  Arrival with diffuse body urticaria.  Patient denies dyspnea or tightness of the throat.  Patient is anxious because she has a history of allergic reaction to penicillin and sulfa antibiotics.  Patient denies pain.  Patient took over-the-counter Benadryl prior to arrival.         Past Medical History:  Diagnosis Date  . Anxiety   . Depression     Patient Active Problem List   Diagnosis Date Noted  . Severe major depression without psychotic features ([place]) 05/09/2014  . OCD (obsessive compulsive disorder) 05/09/2014  . Suicidal ideations 05/08/2014  . Alcohol abuse 11/16/2012    Class: Acute  . Benzodiazepine abuse, continuous ([place]) 11/16/2012    Class: Acute    Past Surgical History:  Procedure Laterality Date  . NO PAST SURGERIES      Prior to Admission medications   Medication Sig Start Date End Date Taking? Authorizing Provider  FLUoxetine (PROZAC) 40 MG capsule Take 1 capsule (40 mg total) by mouth daily. For depression 05/18/14   Lindell Spar I, NP  hydrOXYzine (ATARAX/VISTARIL) 25 MG tablet Take 1 tablet (25 mg) three times daily as needed: For anxiety 05/18/14   Lindell Spar I, NP  hydrOXYzine (ATARAX/VISTARIL) 50 MG tablet Take 1 tablet (50 mg total) by mouth 3 (three) times daily as needed for itching. 01/25/19   Sable Feil, PA-C  methylPREDNISolone (MEDROL DOSEPAK) 4 MG TBPK tablet Take  Tapered dose as directed 01/25/19   Sable Feil, PA-C  mirtazapine (REMERON) 30 MG tablet Take 1 tablet (30 mg total) by mouth at bedtime. For depression/sleep 05/18/14   Lindell Spar I, NP    Allergies Penicillins, Ambien [zolpidem tartrate], Sulfa antibiotics, and Trazodone and nefazodone  No family history on file.  Social History Social History   Tobacco Use  . Smoking status: Former Smoker    Packs/day: 0.50    Years: 0.50    Pack years: 0.25    Types: Cigarettes  . Smokeless tobacco: Never Used  Substance Use Topics  . Alcohol use: Yes    Comment: one marjuarita every 2 months  . Drug use: Yes    Types: Benzodiazepines    Review of Systems Constitutional: No fever/chills Eyes: No visual changes. ENT: No sore throat. Cardiovascular: Denies chest pain. Respiratory: Denies shortness of breath. Gastrointestinal: No abdominal pain.  No nausea, no vomiting.  No diarrhea.  No constipation. Genitourinary: Negative for dysuria. Musculoskeletal: Negative for back pain. Skin: Positive for rash. Neurological: Negative for headaches, focal weakness or numbness. Psychiatric:  Anxiety and depression  Allergic/Immunilogical: Penicillin, Ambien, sulfa antibiotics, trazodone. ____________________________________________   PHYSICAL EXAM:  VITAL SIGNS: ED Triage Vitals  Enc Vitals Group     BP      Pulse      Resp      Temp      Temp src      SpO2  Weight      Height      Head Circumference      Peak Flow      Pain Score      Pain Loc      Pain Edu?      Excl. in GC?     Constitutional: Alert and oriented. Well appearing and in no acute distress. Eyes: Conjunctivae are normal. PERRL. EOMI. Head: Atraumatic. Nose: No congestion/rhinnorhea. Mouth/Throat: Mucous membranes are moist.  Oropharynx non-erythematous. Neck: No stridor.   Hematological/Lymphatic/Immunilogical: No cervical lymphadenopathy. Cardiovascular: Normal rate, regular rhythm. Grossly normal  heart sounds.  Good peripheral circulation. Respiratory: Normal respiratory effort.  No retractions. Lungs CTAB. Gastrointestinal: Soft and nontender. No distention. No abdominal bruits. No CVA tenderness. Genitourinary: Deferred Musculoskeletal: No lower extremity tenderness nor edema.  No joint effusions. Neurologic:  Normal speech and language. No gross focal neurologic deficits are appreciated. No gait instability. Skin:  Skin is warm, dry and intact.  Diffuse macular lesions.  Psychiatric: Mood and affect are normal. Speech and behavior are normal.  ____________________________________________   LABS (all labs ordered are listed, but only abnormal results are displayed)  Labs Reviewed - No data to display ____________________________________________  EKG  Read by heart station Dr. with no acute findings. ____________________________________________  RADIOLOGY  ED MD interpretation:    Official radiology report(s): No results found.  ____________________________________________   PROCEDURES  Procedure(s) performed (including Critical Care):  Procedures   ____________________________________________   INITIAL IMPRESSION / ASSESSMENT AND PLAN / ED COURSE  As part of my medical decision making, I reviewed the following data within the electronic MEDICAL RECORD NUMBER         Patient presents with a rash secondary to insect bite prior to arrival.  Patient denies dyspnea or any other anaphylactic signs or symptoms.  Differential consist of urticaria secondary to allergic reaction from insect bite versus anaphylactic.  Patient responded well to Benadryl, Pepcid, and Solu-Medrol given IV.  Rash more than 90% resolved.  Patient given discharge care instruction advised take medication as directed.     ____________________________________________   FINAL CLINICAL IMPRESSION(S) / ED DIAGNOSES  Final diagnoses:  Hives     ED Discharge Orders         Ordered     hydrOXYzine (ATARAX/VISTARIL) 50 MG tablet  3 times daily PRN     01/25/19 1808    methylPREDNISolone (MEDROL DOSEPAK) 4 MG TBPK tablet     01/25/19 1808           Note:  This document was prepared using Dragon voice recognition software and may include unintentional dictation errors.    Joni ReiningSmith, Jaymere Alen K, PA-C 01/25/19 1810    Phineas SemenGoodman, [place], MD 01/25/19 [phone]

## 2019-01-25 NOTE — ED Notes (Signed)
Patient is resting on side. Denies shortness of breath. Color is less red at this time.

## 2019-01-25 NOTE — ED Triage Notes (Signed)
Pt to ED via POV c/o allergic reaction. Pt states that she was on the ladder working and felt something bite her on her left breast. Pt has hives all over that itch and eye swelling. Pt states that she took 2 benadryl PTA. Pt denies shortness of breath or feeling like her throat is closing.

## 2019-01-25 NOTE — ED Notes (Signed)
Patient has a steady gait. No signs of hives or discoloration noted on skin.

## 2019-01-28 DIAGNOSIS — Z03818 Encounter for observation for suspected exposure to other biological agents ruled out: Secondary | ICD-10-CM | POA: Diagnosis not present

## 2019-04-10 DIAGNOSIS — Z03818 Encounter for observation for suspected exposure to other biological agents ruled out: Secondary | ICD-10-CM | POA: Diagnosis not present

## 2019-04-28 DIAGNOSIS — Z03818 Encounter for observation for suspected exposure to other biological agents ruled out: Secondary | ICD-10-CM | POA: Diagnosis not present

## 2019-05-06 DIAGNOSIS — Z03818 Encounter for observation for suspected exposure to other biological agents ruled out: Secondary | ICD-10-CM | POA: Diagnosis not present

## 2019-05-27 DIAGNOSIS — Z03818 Encounter for observation for suspected exposure to other biological agents ruled out: Secondary | ICD-10-CM | POA: Diagnosis not present

## 2019-06-02 DIAGNOSIS — Z03818 Encounter for observation for suspected exposure to other biological agents ruled out: Secondary | ICD-10-CM | POA: Diagnosis not present

## 2019-06-10 DIAGNOSIS — Z03818 Encounter for observation for suspected exposure to other biological agents ruled out: Secondary | ICD-10-CM | POA: Diagnosis not present

## 2019-06-24 DIAGNOSIS — Z03818 Encounter for observation for suspected exposure to other biological agents ruled out: Secondary | ICD-10-CM | POA: Diagnosis not present

## 2019-06-30 DIAGNOSIS — Z03818 Encounter for observation for suspected exposure to other biological agents ruled out: Secondary | ICD-10-CM | POA: Diagnosis not present

## 2019-07-02 DIAGNOSIS — Z03818 Encounter for observation for suspected exposure to other biological agents ruled out: Secondary | ICD-10-CM | POA: Diagnosis not present

## 2019-07-02 DIAGNOSIS — R5383 Other fatigue: Secondary | ICD-10-CM | POA: Diagnosis not present

## 2019-07-02 DIAGNOSIS — Z1231 Encounter for screening mammogram for malignant neoplasm of breast: Secondary | ICD-10-CM | POA: Diagnosis not present

## 2019-07-02 DIAGNOSIS — F411 Generalized anxiety disorder: Secondary | ICD-10-CM | POA: Diagnosis not present

## 2019-07-07 DIAGNOSIS — Z03818 Encounter for observation for suspected exposure to other biological agents ruled out: Secondary | ICD-10-CM | POA: Diagnosis not present

## 2019-07-14 DIAGNOSIS — Z03818 Encounter for observation for suspected exposure to other biological agents ruled out: Secondary | ICD-10-CM | POA: Diagnosis not present

## 2019-07-16 DIAGNOSIS — Z03818 Encounter for observation for suspected exposure to other biological agents ruled out: Secondary | ICD-10-CM | POA: Diagnosis not present

## 2019-07-21 DIAGNOSIS — Z03818 Encounter for observation for suspected exposure to other biological agents ruled out: Secondary | ICD-10-CM | POA: Diagnosis not present

## 2019-07-23 DIAGNOSIS — Z03818 Encounter for observation for suspected exposure to other biological agents ruled out: Secondary | ICD-10-CM | POA: Diagnosis not present

## 2019-07-28 DIAGNOSIS — Z03818 Encounter for observation for suspected exposure to other biological agents ruled out: Secondary | ICD-10-CM | POA: Diagnosis not present

## 2019-07-30 DIAGNOSIS — Z03818 Encounter for observation for suspected exposure to other biological agents ruled out: Secondary | ICD-10-CM | POA: Diagnosis not present

## 2019-08-04 DIAGNOSIS — F411 Generalized anxiety disorder: Secondary | ICD-10-CM | POA: Diagnosis not present

## 2019-08-04 DIAGNOSIS — R5383 Other fatigue: Secondary | ICD-10-CM | POA: Diagnosis not present

## 2019-08-04 DIAGNOSIS — Z03818 Encounter for observation for suspected exposure to other biological agents ruled out: Secondary | ICD-10-CM | POA: Diagnosis not present

## 2019-08-20 DIAGNOSIS — Z0000 Encounter for general adult medical examination without abnormal findings: Secondary | ICD-10-CM | POA: Diagnosis not present

## 2019-08-20 DIAGNOSIS — Z23 Encounter for immunization: Secondary | ICD-10-CM | POA: Diagnosis not present

## 2019-08-20 DIAGNOSIS — Z6826 Body mass index (BMI) 26.0-26.9, adult: Secondary | ICD-10-CM | POA: Diagnosis not present

## 2019-08-20 DIAGNOSIS — Z0184 Encounter for antibody response examination: Secondary | ICD-10-CM | POA: Diagnosis not present

## 2019-10-23 ENCOUNTER — Other Ambulatory Visit: Payer: Self-pay

## 2019-10-23 MED ORDER — BUSPIRONE HCL 10 MG PO TABS: 10.0000 mg | ORAL_TABLET | Freq: Two times a day (BID) | ORAL | 1 refills | Status: DC

## 2019-11-05 ENCOUNTER — Encounter: Payer: Self-pay | Admitting: Physician Assistant

## 2019-11-05 ENCOUNTER — Ambulatory Visit (INDEPENDENT_AMBULATORY_CARE_PROVIDER_SITE_OTHER): Payer: Self-pay | Admitting: Physician Assistant

## 2019-11-05 ENCOUNTER — Other Ambulatory Visit: Payer: Self-pay | Admitting: Physician Assistant

## 2019-11-05 ENCOUNTER — Other Ambulatory Visit: Payer: Self-pay

## 2019-11-05 VITALS — BP 110/64 | HR 75 | Temp 97.6°F | Resp 16 | Ht 62.0 in | Wt 141.0 lb

## 2019-11-05 DIAGNOSIS — F418 Other specified anxiety disorders: Secondary | ICD-10-CM

## 2019-11-05 MED ORDER — DULOXETINE HCL 30 MG PO CPEP: 30.0000 mg | ORAL_CAPSULE | Freq: Every day | ORAL | 1 refills | Status: DC

## 2019-11-05 MED ORDER — LORAZEPAM 0.5 MG PO TABS: 0.5000 mg | ORAL_TABLET | Freq: Every day | ORAL | 0 refills | Status: AC | PRN

## 2019-11-05 NOTE — Progress Notes (Signed)
Established Patient Office Visit  Subjective:  Patient ID: Heather Reilly, female    DOB: 1965/12/17  Age: 54 y.o. MRN: [id number]  CC:  Chief Complaint  Patient presents with  . Depression  . Anxiety    HPI Heather Reilly presents for follow up of anxiety and depression - she is currently on buspar 10mg  bid and had stopped prozac but she is at the point she needs to restart medication for her depression and states her anxiety is 'off the chart' - she has tried several meds in the past and asks to try cymbalta that family members have told her about  Past Medical History:  Diagnosis Date  . Anxiety   . Depression     Past Surgical History:  Procedure Laterality Date  . NO PAST SURGERIES      Family History  Problem Relation Age of Onset  . Parkinson's disease Father     Social History   Socioeconomic History  . Marital status: Single    Spouse name: Not on file  . Number of children: Not on file  . Years of education: Not on file  . Highest education level: Not on file  Occupational History  . Not on file  Tobacco Use  . Smoking status: Former Smoker    Packs/day: 0.50    Years: 0.50    Pack years: 0.25    Types: Cigarettes  . Smokeless tobacco: Never Used  Substance and Sexual Activity  . Alcohol use: Yes    Comment: one marjuarita every 2 months  . Drug use: Yes    Types: Benzodiazepines  . Sexual activity: Not on file  Other Topics Concern  . Not on file  Social History Narrative  . Not on file   Social Determinants of Health   Financial Resource Strain:   . Difficulty of Paying Living Expenses:   Food Insecurity:   . Worried About in the Last Year:   . Programme researcher, broadcasting/film/video in the Last Year:   Transportation Needs:   . Barista (Medical):   Freight forwarder Lack of Transportation (Non-Medical):   Physical Activity:   . Days of Exercise per Week:   . Minutes of Exercise per Session:   Stress:   . Feeling of Stress :   Social  Connections:   . Frequency of Communication with Friends and Family:   . Frequency of Social Gatherings with Friends and Family:   . Attends Religious Services:   . Active Member of Clubs or Organizations:   . Attends [employer] Meetings:   Banker Marital Status:   Intimate Partner Violence:   . Fear of Current or Ex-Partner:   . Emotionally Abused:   [employer] Physically Abused:   . Sexually Abused:      Current Outpatient Medications:  .  busPIRone (BUSPAR) 10 MG tablet, Take 1 tablet (10 mg total) by mouth 2 (two) times daily., Disp: 60 tablet, Rfl: 1 .  DULoxetine (CYMBALTA) 30 MG capsule, Take 1 capsule (30 mg total) by mouth daily., Disp: 30 capsule, Rfl: 1 .  LORazepam (ATIVAN) 0.5 MG tablet, Take 1 tablet (0.5 mg total) by mouth daily as needed for anxiety., Disp: 30 tablet, Rfl: 0   Allergies  Allergen Reactions  . Penicillins Hives, Shortness Of Breath and Itching  . Ambien [Zolpidem Tartrate]     Does not make her sleep  . Sulfa Antibiotics Hives and Itching  . Sulfacetamide Sodium Itching  and Hives  . Trazodone And Nefazodone     Does not make her sleep    ROS CONSTITUTIONAL: Negative for chills, fatigue, fever, unintentional weight gain and unintentional weight loss.   CARDIOVASCULAR: Negative for chest pain, dizziness, palpitations and pedal edema.  RESPIRATORY: Negative for recent cough and dyspnea.   PSYCHIATRIC: see HPI      Objective:    PHYSICAL EXAM:   VS: BP 110/64   Pulse 75   Temp 97.6 F (36.4 C)   Resp 16   Ht 5\' 2"  (1.575 m)   Wt 141 lb (64 kg)   LMP 05/14/2013   SpO2 98%   BMI 25.79 kg/m   GEN: Well nourished, well developed, in no acute distress   Cardiac: RRR; no murmurs, rubs, or gallops,no edema - no significant varicosities Respiratory:  normal respiratory rate and pattern with no distress - normal breath sounds with no rales, rhonchi, wheezes or rubs  Neuro:  Alert and Oriented x 3, Strength and sensation are intact - CN  II-Xii grossly intact Psych: euthymic mood, appropriate affect and demeanor - anxious  BP 110/64   Pulse 75   Temp 97.6 F (36.4 C)   Resp 16   Ht 5\' 2"  (1.575 m)   Wt 141 lb (64 kg)   LMP 05/14/2013   SpO2 98%   BMI 25.79 kg/m  Wt Readings from Last 3 Encounters:  11/05/19 141 lb (64 kg)  03/14/14 112 lb (50.8 kg)     Health Maintenance Due  Topic Date Due  . Hepatitis C Screening  Never done  . HIV Screening  Never done  . TETANUS/TDAP  Never done  . COLONOSCOPY  Never done  . PAP SMEAR-Modifier  04/03/2015  . MAMMOGRAM  11/01/2017  . INFLUENZA VACCINE  Never done    There are no preventive care reminders to display for this patient.  Lab Results  Component Value Date   TSH 0.954 03/14/2014   Lab Results  Component Value Date   WBC 6.8 05/07/2014   HGB 14.7 05/07/2014   HCT 42.4 05/07/2014   MCV 87.6 05/07/2014   PLT 253 05/07/2014   Lab Results  Component Value Date   NA 139 05/07/2014   K 4.0 05/07/2014   CO2 26 05/07/2014   GLUCOSE 109 (H) 05/07/2014   BUN 10 05/07/2014   CREATININE 0.64 05/07/2014   BILITOT 0.4 05/07/2014   ALKPHOS 109 05/07/2014   AST 22 05/07/2014   ALT 19 05/07/2014   PROT 8.2 05/07/2014   ALBUMIN 4.2 05/07/2014   CALCIUM 9.7 05/07/2014   ANIONGAP 14 05/07/2014   Lab Results  Component Value Date   CHOL 214 (H) 11/16/2012   Lab Results  Component Value Date   HDL 97 11/16/2012   Lab Results  Component Value Date   LDLCALC 102 (H) 11/16/2012   Lab Results  Component Value Date   TRIG 76 11/16/2012   Lab Results  Component Value Date   CHOLHDL 2.2 11/16/2012   Lab Results  Component Value Date   HGBA1C 5.1 11/16/2012      Assessment & Plan:   Problem List Items Addressed This Visit      Other   Anxiety associated with depression - Primary   Relevant Medications   DULoxetine (CYMBALTA) 30 MG capsule   LORazepam (ATIVAN) 0.5 MG tablet      Meds ordered this encounter  Medications  . DULoxetine  (CYMBALTA) 30 MG capsule    Sig: Take  1 capsule (30 mg total) by mouth daily.    Dispense:  30 capsule    Refill:  1    Order Specific Question:   Supervising Provider    AnswerRochel Brome [id number]  . LORazepam (ATIVAN) 0.5 MG tablet    Sig: Take 1 tablet (0.5 mg total) by mouth daily as needed for anxiety.    Dispense:  30 tablet    Refill:  0    Order Specific Question:   Supervising Provider    AnswerShelton Silvas    Follow-up: Return in about 1 month (around 12/06/2019) for FOLLOW UP.    SARA R Georgeann Brinkman, PA-C

## 2019-11-08 ENCOUNTER — Other Ambulatory Visit: Payer: Self-pay | Admitting: Physician Assistant

## 2019-12-08 ENCOUNTER — Ambulatory Visit: Payer: Self-pay | Admitting: Physician Assistant

## 2019-12-26 ENCOUNTER — Other Ambulatory Visit: Payer: Self-pay | Admitting: Internal Medicine

## 2019-12-26 DIAGNOSIS — Z1231 Encounter for screening mammogram for malignant neoplasm of breast: Secondary | ICD-10-CM

## 2020-09-07 ENCOUNTER — Other Ambulatory Visit: Payer: Self-pay | Admitting: Internal Medicine

## 2020-09-07 DIAGNOSIS — R1013 Epigastric pain: Secondary | ICD-10-CM

## 2021-02-04 ENCOUNTER — Other Ambulatory Visit: Payer: Self-pay | Admitting: Internal Medicine

## 2021-02-04 DIAGNOSIS — Z1231 Encounter for screening mammogram for malignant neoplasm of breast: Secondary | ICD-10-CM

## 2021-02-08 ENCOUNTER — Ambulatory Visit
Admission: RE | Admit: 2021-02-08 | Discharge: 2021-02-08 | Disposition: A | Discharge status: Home / Self Care | Payer: Commercial Managed Care - PPO | Source: Ambulatory Visit | Attending: Internal Medicine | Admitting: Internal Medicine

## 2021-02-08 ENCOUNTER — Other Ambulatory Visit: Payer: Self-pay

## 2021-02-08 DIAGNOSIS — Z1231 Encounter for screening mammogram for malignant neoplasm of breast: Secondary | ICD-10-CM | POA: Diagnosis present

## 2022-01-04 ENCOUNTER — Other Ambulatory Visit: Payer: Self-pay | Admitting: Internal Medicine

## 2022-01-04 DIAGNOSIS — Z1231 Encounter for screening mammogram for malignant neoplasm of breast: Secondary | ICD-10-CM

## 2022-02-22 IMAGING — MG DIGITAL SCREENING BREAST BILAT IMPLANT W/ TOMO W/ CAD
8 of 17 series · 8 of 33 positions shown · non-contrast
Comparison: Previous exam(s).

CLINICAL DATA: Screening.

EXAM:
DIGITAL SCREENING BILATERAL MAMMOGRAM WITH IMPLANTS, CAD AND
TOMOSYNTHESIS
TECHNIQUE: Bilateral screening digital craniocaudal and mediolateral oblique
mammograms were obtained. Bilateral screening digital breast
tomosynthesis was performed. The images were evaluated with
computer-aided detection. Standard and/or implant displaced views
were performed.

[R CC]
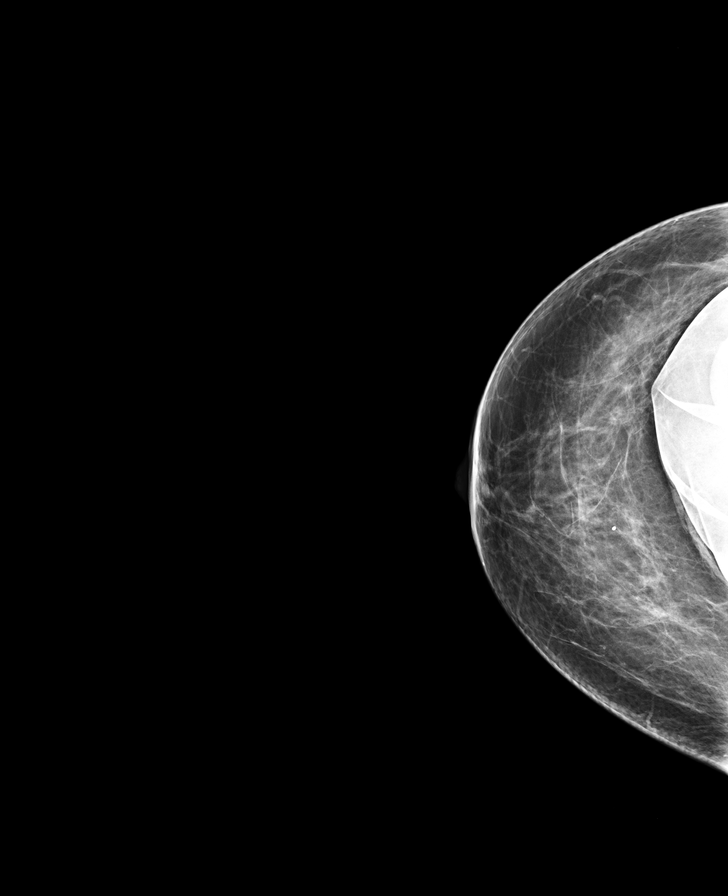

[L MLO]
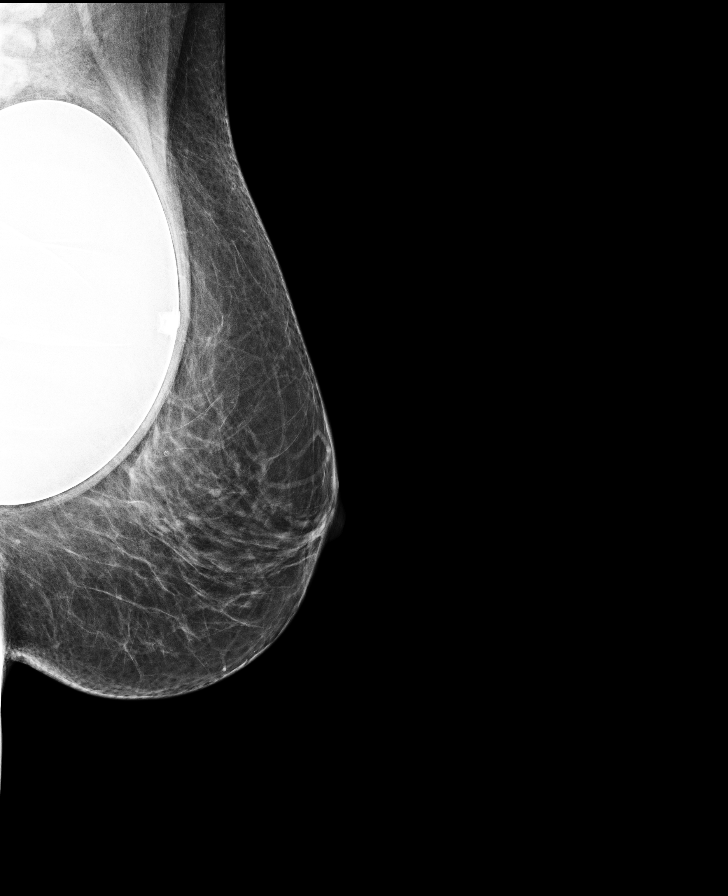

[L CC (1 of 2)]
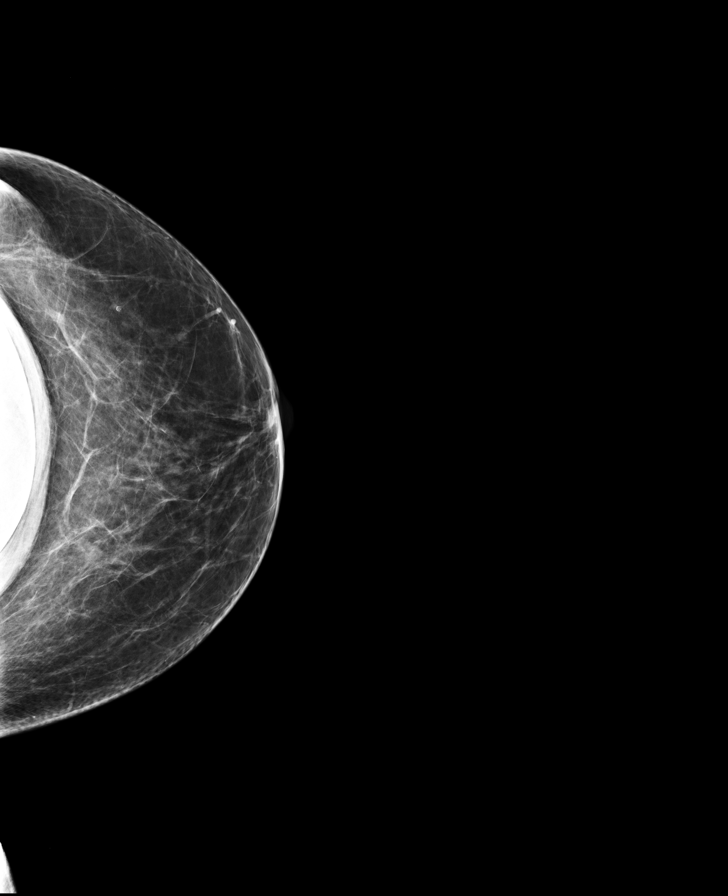

[L CC (2 of 2)]
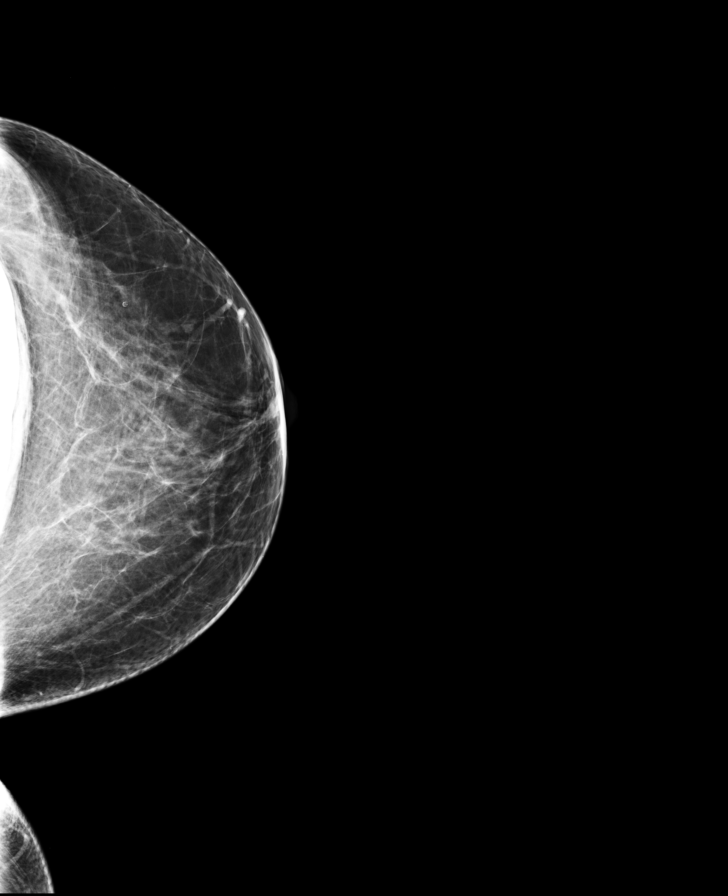

[R MLO]
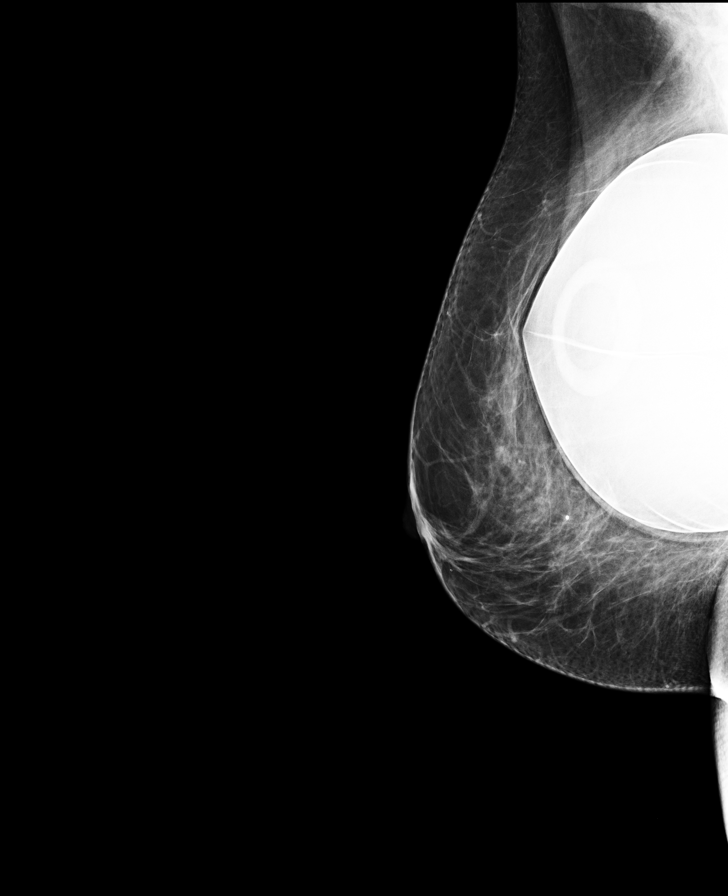

[R CC synth-2D]
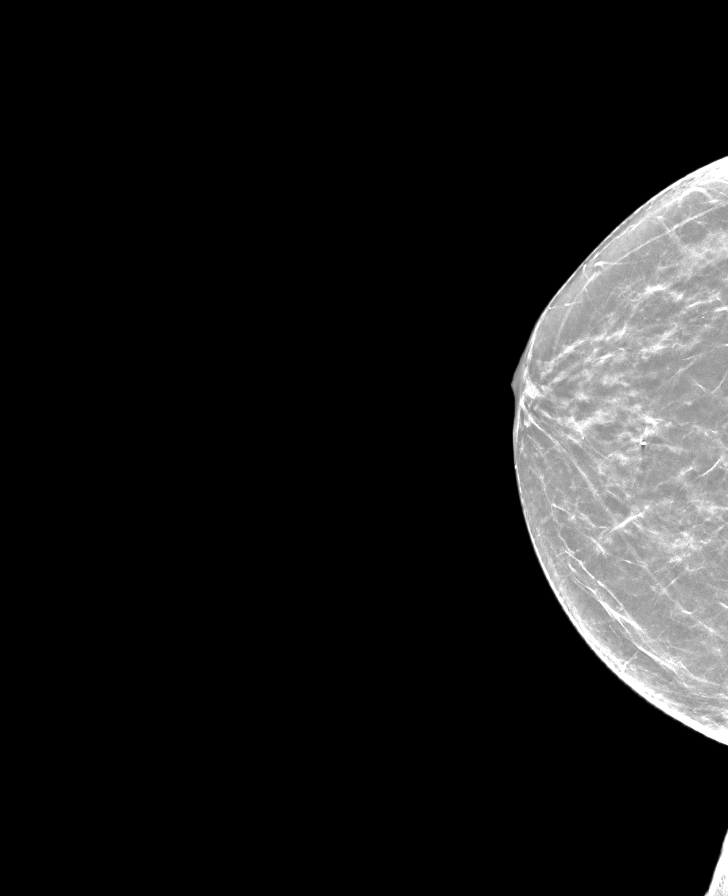

[L CC synth-2D]
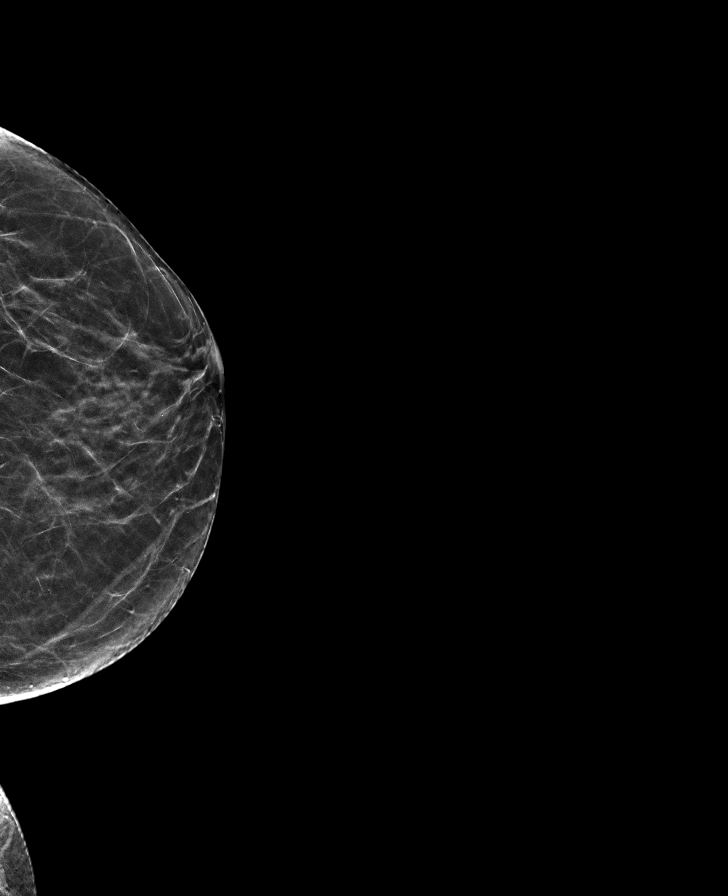

[R MLO synth-2D]
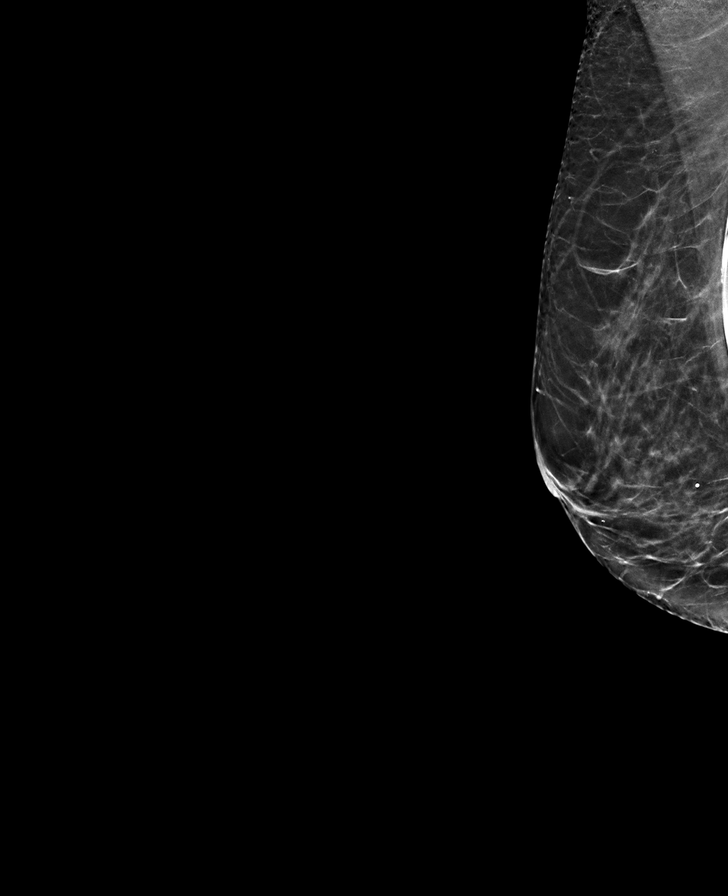

[8 of 33 positions shown; findings below may reference images not displayed]

ACR Breast Density Category b: There are scattered areas of
fibroglandular density.
FINDINGS: The patient has retropectoral implants. There are no findings
suspicious for malignancy.
IMPRESSION: No mammographic evidence of malignancy. A result letter of this
screening mammogram will be mailed directly to the patient.

RECOMMENDATION:
Screening mammogram in one year. (Code:SE-S-JMG)

BI-RADS CATEGORY  1:  Negative.

## 2022-04-14 DEATH — deceased
